# Patient Record
Sex: Female | Born: 1950 | Race: Black or African American | Hispanic: No | Marital: Married | Smoking: Never smoker
Health system: Southern US, Community
[De-identification: ages and names within clinical notes are randomized; demographics above are authoritative.]

## PROBLEM LIST (undated history)

## (undated) DIAGNOSIS — S92902A Unspecified fracture of left foot, initial encounter for closed fracture: Secondary | ICD-10-CM

## (undated) DIAGNOSIS — M170 Bilateral primary osteoarthritis of knee: Secondary | ICD-10-CM

## (undated) DIAGNOSIS — K219 Gastro-esophageal reflux disease without esophagitis: Secondary | ICD-10-CM

## (undated) DIAGNOSIS — K449 Diaphragmatic hernia without obstruction or gangrene: Secondary | ICD-10-CM

## (undated) DIAGNOSIS — M8580 Other specified disorders of bone density and structure, unspecified site: Secondary | ICD-10-CM

## (undated) DIAGNOSIS — M1711 Unilateral primary osteoarthritis, right knee: Secondary | ICD-10-CM

## (undated) DIAGNOSIS — Z98891 History of uterine scar from previous surgery: Secondary | ICD-10-CM

## (undated) DIAGNOSIS — M1990 Unspecified osteoarthritis, unspecified site: Secondary | ICD-10-CM

## (undated) DIAGNOSIS — I10 Essential (primary) hypertension: Secondary | ICD-10-CM

## (undated) DIAGNOSIS — D649 Anemia, unspecified: Secondary | ICD-10-CM

## (undated) DIAGNOSIS — R7303 Prediabetes: Secondary | ICD-10-CM

## (undated) DIAGNOSIS — K7689 Other specified diseases of liver: Secondary | ICD-10-CM

## (undated) DIAGNOSIS — M47816 Spondylosis without myelopathy or radiculopathy, lumbar region: Secondary | ICD-10-CM

## (undated) DIAGNOSIS — N938 Other specified abnormal uterine and vaginal bleeding: Secondary | ICD-10-CM

## (undated) DIAGNOSIS — E785 Hyperlipidemia, unspecified: Secondary | ICD-10-CM

## (undated) DIAGNOSIS — N281 Cyst of kidney, acquired: Secondary | ICD-10-CM

## (undated) HISTORY — PX: JOINT REPLACEMENT: SHX530

---

## 2004-03-06 ENCOUNTER — Ambulatory Visit: Payer: Self-pay | Admitting: Unknown Physician Specialty

## 2005-12-11 ENCOUNTER — Ambulatory Visit: Payer: Self-pay | Admitting: Unknown Physician Specialty

## 2007-02-02 ENCOUNTER — Ambulatory Visit: Payer: Self-pay | Admitting: Unknown Physician Specialty

## 2007-09-23 ENCOUNTER — Ambulatory Visit: Payer: Self-pay | Admitting: Unknown Physician Specialty

## 2008-07-06 ENCOUNTER — Ambulatory Visit: Payer: Self-pay | Admitting: Unknown Physician Specialty

## 2009-10-26 ENCOUNTER — Ambulatory Visit: Payer: Self-pay | Admitting: Unknown Physician Specialty

## 2010-03-24 HISTORY — PX: STOMACH SURGERY: SHX791

## 2010-08-21 ENCOUNTER — Ambulatory Visit: Payer: Self-pay | Admitting: Unknown Physician Specialty

## 2010-08-27 ENCOUNTER — Ambulatory Visit: Payer: Self-pay | Admitting: Unknown Physician Specialty

## 2010-09-10 ENCOUNTER — Ambulatory Visit: Payer: Self-pay | Admitting: Gastroenterology

## 2010-09-11 ENCOUNTER — Ambulatory Visit: Payer: Self-pay | Admitting: Gastroenterology

## 2010-09-11 LAB — PATHOLOGY REPORT

## 2010-09-14 ENCOUNTER — Inpatient Hospital Stay: Payer: Self-pay | Admitting: Surgery

## 2011-02-27 ENCOUNTER — Ambulatory Visit: Payer: Self-pay | Admitting: Unknown Physician Specialty

## 2012-03-01 ENCOUNTER — Ambulatory Visit: Payer: Self-pay | Admitting: Unknown Physician Specialty

## 2013-03-02 ENCOUNTER — Ambulatory Visit: Payer: Self-pay | Admitting: Internal Medicine

## 2014-05-03 ENCOUNTER — Ambulatory Visit: Payer: Self-pay | Admitting: Internal Medicine

## 2015-05-01 ENCOUNTER — Other Ambulatory Visit: Payer: Self-pay | Admitting: Internal Medicine

## 2015-05-01 DIAGNOSIS — Z1231 Encounter for screening mammogram for malignant neoplasm of breast: Secondary | ICD-10-CM

## 2015-05-01 DIAGNOSIS — Z1239 Encounter for other screening for malignant neoplasm of breast: Secondary | ICD-10-CM

## 2015-05-11 ENCOUNTER — Other Ambulatory Visit: Payer: Self-pay | Admitting: Internal Medicine

## 2015-05-11 ENCOUNTER — Ambulatory Visit
Admission: RE | Admit: 2015-05-11 | Discharge: 2015-05-11 | Disposition: A | Discharge status: Home / Self Care | Payer: Medicare Other | Source: Ambulatory Visit | Attending: Internal Medicine | Admitting: Internal Medicine

## 2015-05-11 DIAGNOSIS — Z1231 Encounter for screening mammogram for malignant neoplasm of breast: Secondary | ICD-10-CM | POA: Diagnosis present

## 2015-05-11 DIAGNOSIS — Z1239 Encounter for other screening for malignant neoplasm of breast: Secondary | ICD-10-CM

## 2016-04-03 ENCOUNTER — Other Ambulatory Visit: Payer: Self-pay | Admitting: Internal Medicine

## 2016-04-03 DIAGNOSIS — Z1231 Encounter for screening mammogram for malignant neoplasm of breast: Secondary | ICD-10-CM

## 2016-05-12 ENCOUNTER — Ambulatory Visit
Admission: RE | Admit: 2016-05-12 | Discharge: 2016-05-12 | Disposition: A | Discharge status: Home / Self Care | Payer: Medicare Other | Source: Ambulatory Visit | Attending: Internal Medicine | Admitting: Internal Medicine

## 2016-05-12 DIAGNOSIS — Z1231 Encounter for screening mammogram for malignant neoplasm of breast: Secondary | ICD-10-CM

## 2017-05-19 ENCOUNTER — Other Ambulatory Visit: Payer: Self-pay | Admitting: Internal Medicine

## 2017-05-19 DIAGNOSIS — Z1231 Encounter for screening mammogram for malignant neoplasm of breast: Secondary | ICD-10-CM

## 2017-05-21 ENCOUNTER — Ambulatory Visit
Admission: RE | Admit: 2017-05-21 | Discharge: 2017-05-21 | Disposition: A | Discharge status: Home / Self Care | Payer: Medicare Other | Source: Ambulatory Visit | Attending: Internal Medicine | Admitting: Internal Medicine

## 2017-05-21 DIAGNOSIS — Z1231 Encounter for screening mammogram for malignant neoplasm of breast: Secondary | ICD-10-CM

## 2017-08-04 ENCOUNTER — Encounter: Payer: Self-pay | Admitting: *Deleted

## 2017-08-04 ENCOUNTER — Emergency Department
Admission: EM | Admit: 2017-08-04 | Discharge: 2017-08-05 | Disposition: A | Discharge status: Home / Self Care | Payer: Medicare Other | Attending: Emergency Medicine | Admitting: Emergency Medicine

## 2017-08-04 ENCOUNTER — Other Ambulatory Visit: Payer: Self-pay

## 2017-08-04 DIAGNOSIS — R42 Dizziness and giddiness: Secondary | ICD-10-CM | POA: Insufficient documentation

## 2017-08-04 DIAGNOSIS — R61 Generalized hyperhidrosis: Secondary | ICD-10-CM | POA: Diagnosis not present

## 2017-08-04 DIAGNOSIS — I10 Essential (primary) hypertension: Secondary | ICD-10-CM | POA: Diagnosis not present

## 2017-08-04 DIAGNOSIS — R55 Syncope and collapse: Secondary | ICD-10-CM

## 2017-08-04 DIAGNOSIS — E860 Dehydration: Secondary | ICD-10-CM

## 2017-08-04 DIAGNOSIS — H538 Other visual disturbances: Secondary | ICD-10-CM | POA: Insufficient documentation

## 2017-08-04 LAB — BASIC METABOLIC PANEL
ANION GAP: 12 (ref 5–15)
BUN: 29 mg/dL — ABNORMAL HIGH (ref 6–20)
CO2: 26 mmol/L (ref 22–32)
Calcium: 9.8 mg/dL (ref 8.9–10.3)
Chloride: 100 mmol/L — ABNORMAL LOW (ref 101–111)
Creatinine, Ser: 1.41 mg/dL — ABNORMAL HIGH (ref 0.44–1.00)
GFR calc Af Amer: 43 mL/min — ABNORMAL LOW (ref >60)
GFR, EST NON AFRICAN AMERICAN: 37 mL/min — AB (ref >60)
GLUCOSE: 144 mg/dL — AB (ref 65–99)
POTASSIUM: 2.6 mmol/L — AB (ref 3.5–5.1)
Sodium: 138 mmol/L (ref 135–145)

## 2017-08-04 LAB — CBC WITH DIFFERENTIAL/PLATELET
BASOS ABS: 0.1 10*3/uL (ref 0–0.1)
Basophils Relative: 1 %
Eosinophils Absolute: 0.2 10*3/uL (ref 0–0.7)
Eosinophils Relative: 2 %
HEMATOCRIT: 40.8 % (ref 35.0–47.0)
HEMOGLOBIN: 13.6 g/dL (ref 12.0–16.0)
LYMPHS PCT: 40 %
Lymphs Abs: 3.9 10*3/uL — ABNORMAL HIGH (ref 1.0–3.6)
MCH: 30.1 pg (ref 26.0–34.0)
MCHC: 33.4 g/dL (ref 32.0–36.0)
MCV: 90.1 fL (ref 80.0–100.0)
Monocytes Absolute: 0.8 10*3/uL (ref 0.2–0.9)
Monocytes Relative: 9 %
NEUTROS ABS: 4.6 10*3/uL (ref 1.4–6.5)
NEUTROS PCT: 48 %
Platelets: 337 10*3/uL (ref 150–440)
RBC: 4.53 MIL/uL (ref 3.80–5.20)
RDW: 12.9 % (ref 11.5–14.5)
WBC: 9.6 10*3/uL (ref 3.6–11.0)

## 2017-08-04 MED ORDER — POTASSIUM CHLORIDE CRYS ER 20 MEQ PO TBCR
40.0000 meq | EXTENDED_RELEASE_TABLET | Freq: Once | ORAL | Status: AC
  Administered 2017-08-04: 40 meq via ORAL
  Filled 2017-08-04: qty 2

## 2017-08-04 MED ORDER — SODIUM CHLORIDE 0.9 % IV BOLUS
1000.0000 mL | Freq: Once | INTRAVENOUS | Status: AC
  Administered 2017-08-04: 1000 mL via INTRAVENOUS

## 2017-08-04 MED ORDER — ONDANSETRON 4 MG PO TBDP: 4.0000 mg | ORAL_TABLET | Freq: Three times a day (TID) | ORAL | 0 refills | Status: DC | PRN

## 2017-08-04 NOTE — ED Triage Notes (Signed)
Pt brought in via ems from [employer] with a syncopal episode.  No chest pain or sob.  Pt alert on arrival to er.

## 2017-08-04 NOTE — ED Provider Notes (Signed)
[facility] Emergency Department Provider Note  ____________________________________________  Time seen: Approximately 11:31 PM  I have reviewed the triage vital signs and the nursing notes.   HISTORY  Chief Complaint Loss of Consciousness    HPI Kristy Richardson is a 67 y.o. female comes the ED complaining of syncope. No preceding symptoms, she was in a restaurant, he suddenly felt hot flashed sweaty and lightheaded with vision darkening. Then woke up on the floor after what she feels like was probably just a few seconds. No significant symptoms afterward. He feels back to normal. Does report that she had not had much water to drink and feels dehydrated.      past medical history GERD Hypertension Hyperlipidemia   There are no active problems to display for this patient.    past surgical history Cesarean section   Prior to Admission medications   Medication Sig Start Date End Date Taking? Authorizing Provider  ondansetron (ZOFRAN ODT) 4 MG disintegrating tablet Take 1 tablet (4 mg total) by mouth every 8 (eight) hours as needed for nausea or vomiting. 08/04/17   Sharman Cheek, MD     Allergies Patient has no known allergies.   Family History  Problem Relation Age of Onset  . Breast cancer Neg Hx     Social History Social History   Tobacco Use  . Smoking status: Never Smoker  . Smokeless tobacco: Never Used  Substance Use Topics  . Alcohol use: Never    Frequency: Never  . Drug use: Never    Review of Systems  Constitutional:   No fever or chills.  ENT:   No sore throat. No rhinorrhea. Cardiovascular:   No chest pain or syncope. Respiratory:   No dyspnea or cough. Gastrointestinal:   Negative for abdominal pain, vomiting and diarrhea.  Musculoskeletal:   Negative for focal pain or swelling All other systems reviewed and are negative except as documented above in ROS and  HPI.  ____________________________________________   PHYSICAL EXAM:  VITAL SIGNS: ED Triage Vitals  Enc Vitals Group     BP 08/04/17 2104 (!) 100/50     Pulse Rate 08/04/17 2104 67     Resp 08/04/17 2104 20     Temp 08/04/17 2100 98 F (36.7 C)     Temp Source 08/04/17 2100 Oral     SpO2 08/04/17 2104 95 %     Weight 08/04/17 2101 187 lb (84.8 kg)     Height 08/04/17 2101  (1.6 m)     Head Circumference --      Peak Flow --      Pain Score 08/04/17 2200 0     Pain Loc --      Pain Edu? --      Excl. in GC? --     Vital signs reviewed, nursing assessments reviewed.   Constitutional:   Alert and oriented. Well appearing and in no distress. Eyes:   Conjunctivae are normal. EOMI. PERRL. ENT      Head:   Normocephalic and atraumatic.      Nose:   No congestion/rhinnorhea.       Mouth/Throat:   dry mucous membranes, no pharyngeal erythema. No peritonsillar mass.       Neck:   No meningismus. Full ROM. Hematological/Lymphatic/Immunilogical:   No cervical lymphadenopathy. Cardiovascular:   RRR. Symmetric bilateral radial and DP pulses.  No murmurs.  Respiratory:   Normal respiratory effort without tachypnea/retractions. Breath sounds are clear and equal bilaterally. No  wheezes/rales/rhonchi. Gastrointestinal:   Soft and nontender. Non distended. There is no CVA tenderness.  No rebound, rigidity, or guarding.  Musculoskeletal:   Normal range of motion in all extremities. No joint effusions.  No lower extremity tenderness.  No edema. Neurologic:   Normal speech and language.  Motor grossly intact. No acute focal neurologic deficits are appreciated.  Skin:    Skin is warm, dry and intact. No rash noted.  No petechiae, purpura, or bullae.  ____________________________________________    LABS (pertinent positives/negatives) (all labs ordered are listed, but only abnormal results are displayed) Labs Reviewed  BASIC METABOLIC PANEL - Abnormal; Notable for the following  components:      Result Value   Potassium 2.6 (*)    Chloride 100 (*)    Glucose, Bld 144 (*)    BUN 29 (*)    Creatinine, Ser 1.41 (*)    GFR calc non Af Amer 37 (*)    GFR calc Af Amer 43 (*)    All other components within normal limits  CBC WITH DIFFERENTIAL/PLATELET - Abnormal; Notable for the following components:   Lymphs Abs 3.9 (*)    All other components within normal limits   ____________________________________________   EKG  interpreted by me  Date: 08/04/2017  Rate: 70  Rhythm: normal sinus rhythm  QRS Axis: normal  Intervals: normal  ST/T Wave abnormalities: normal  Conduction Disutrbances: none  Narrative Interpretation: unremarkable      ____________________________________________    RADIOLOGY  No results found.  ____________________________________________   PROCEDURES Procedures  ____________________________________________    CLINICAL IMPRESSION / ASSESSMENT AND PLAN / ED COURSE  Pertinent labs & imaging results that were available during my care of the patient were reviewed by me and considered in my medical decision making (see chart for details).    patient well appearing no acute distress, presents with syncope. By history highly consistent with vasovagal syncope. No red flag symptoms or exam findings. Vital signs are normal. Does appear to be dehydrated. Labs are unremarkable. Patient given IV fluids, feeling better. We'll discharge home for outpatient follow-up. Low suspicion for ACS PE dissection AAA or mesenteric ischemia biliary disease pancreatitis bowel obstruction or perforation. I doubt stroke intracranial hemorrhage or infectious process.      ____________________________________________   FINAL CLINICAL IMPRESSION(S) / ED DIAGNOSES    Final diagnoses:  Dehydration  Syncope, unspecified syncope type     ED Discharge Orders        Ordered    ondansetron (ZOFRAN ODT) 4 MG disintegrating tablet  Every 8 hours  PRN     08/04/17 2330      Portions of this note were generated with dragon dictation software. Dictation errors may occur despite best attempts at proofreading.    Sharman Cheek, MD 08/04/17 [phone]

## 2017-08-05 NOTE — ED Notes (Signed)
Pt discharged to home.  Family member driving.  Discharge instructions reviewed.  Verbalized understanding.  No questions or concerns at this time.  Teach back verified.  Pt in NAD.  No items left in ED.   

## 2018-01-20 IMAGING — MG MM DIGITAL SCREENING BILAT W/ TOMO W/ CAD
8 of 12 series · 8 of 28 positions shown · non-contrast
Comparison: Previous exam(s).

CLINICAL DATA: Screening.

EXAM:
2D DIGITAL SCREENING BILATERAL MAMMOGRAM WITH CAD AND ADJUNCT TOMO

[R MLO synth-2D]
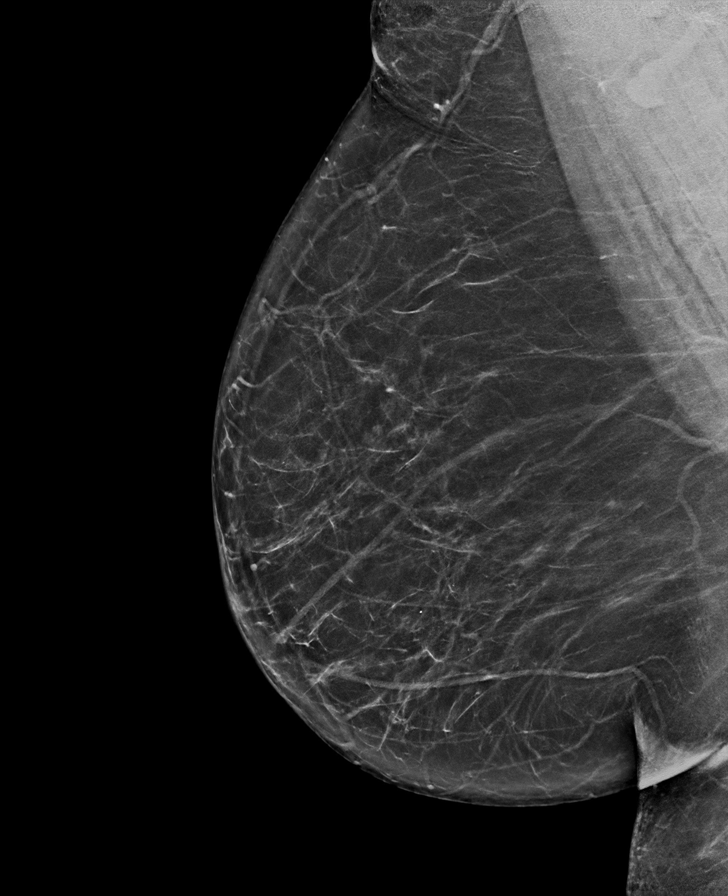

[L MLO synth-2D]
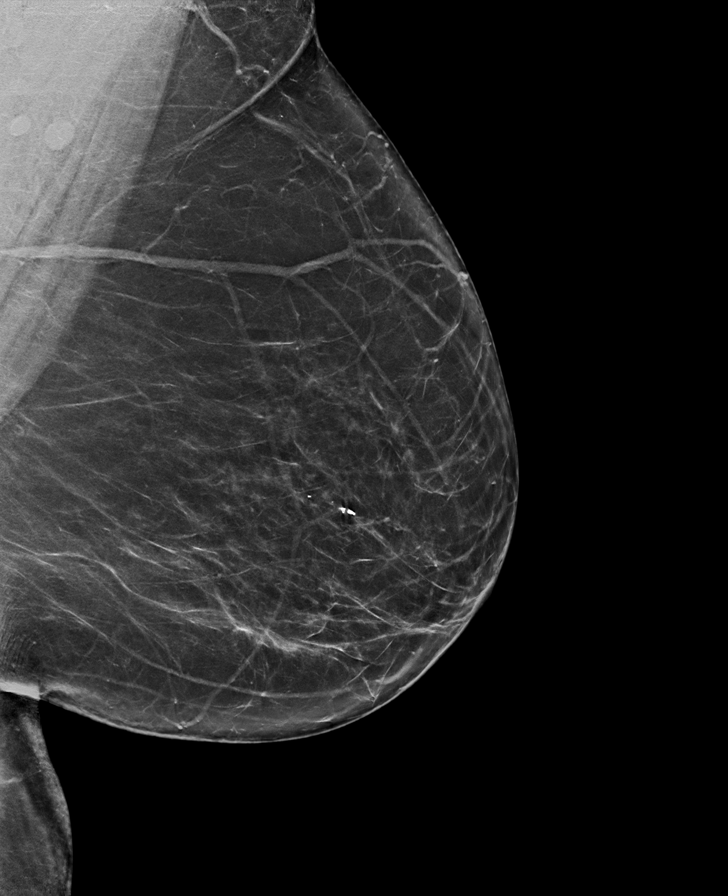

[L MLO]
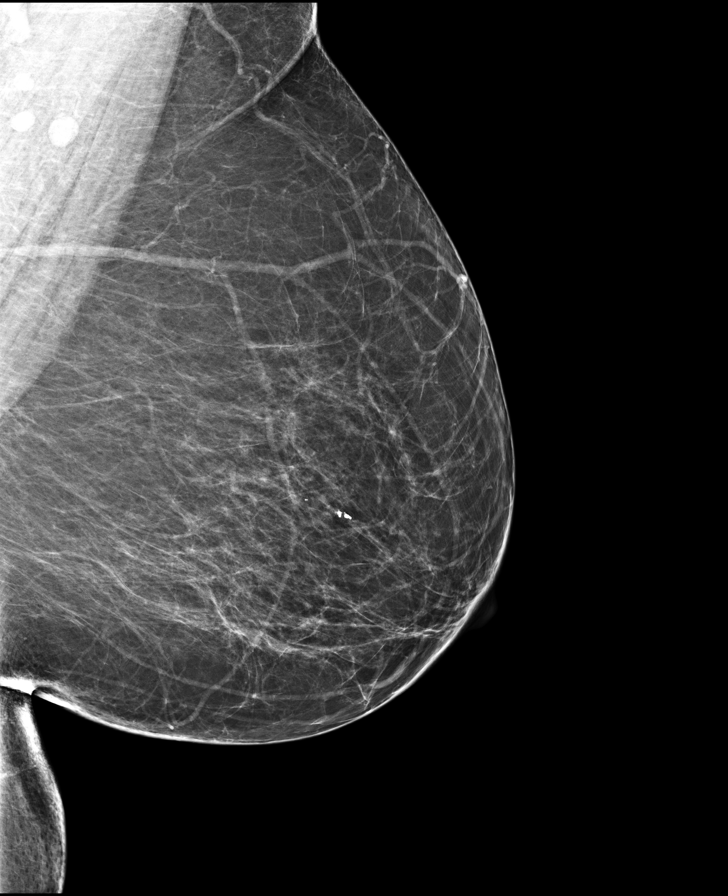

[R CC]
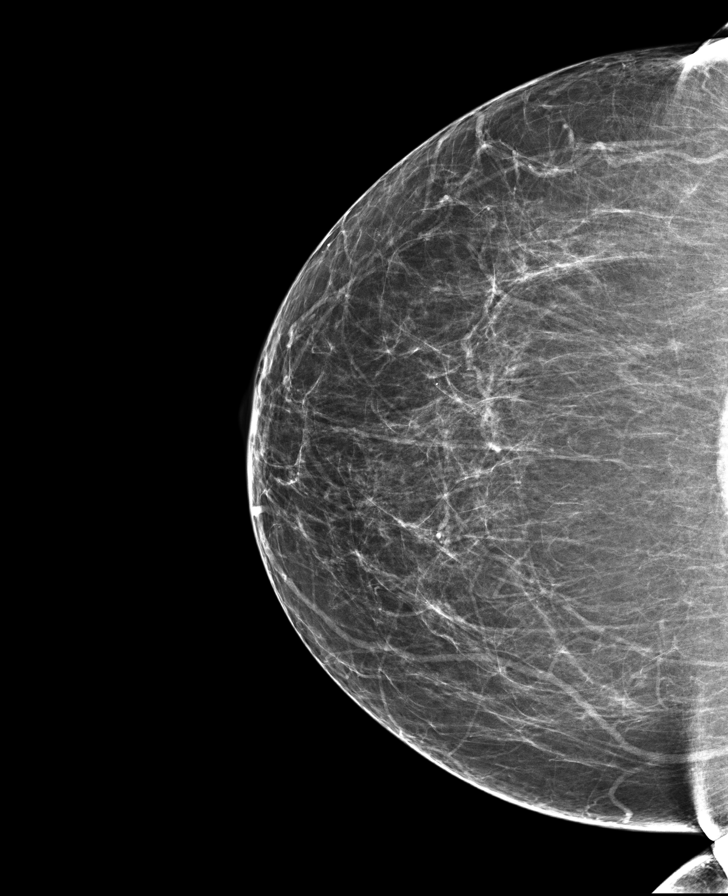

[L CC]
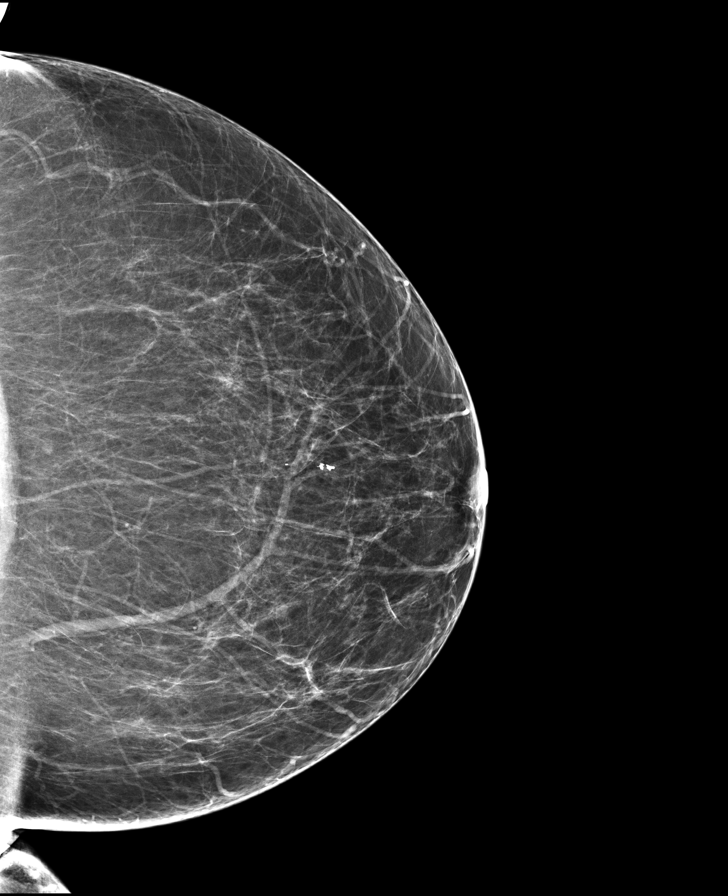

[L CC synth-2D]
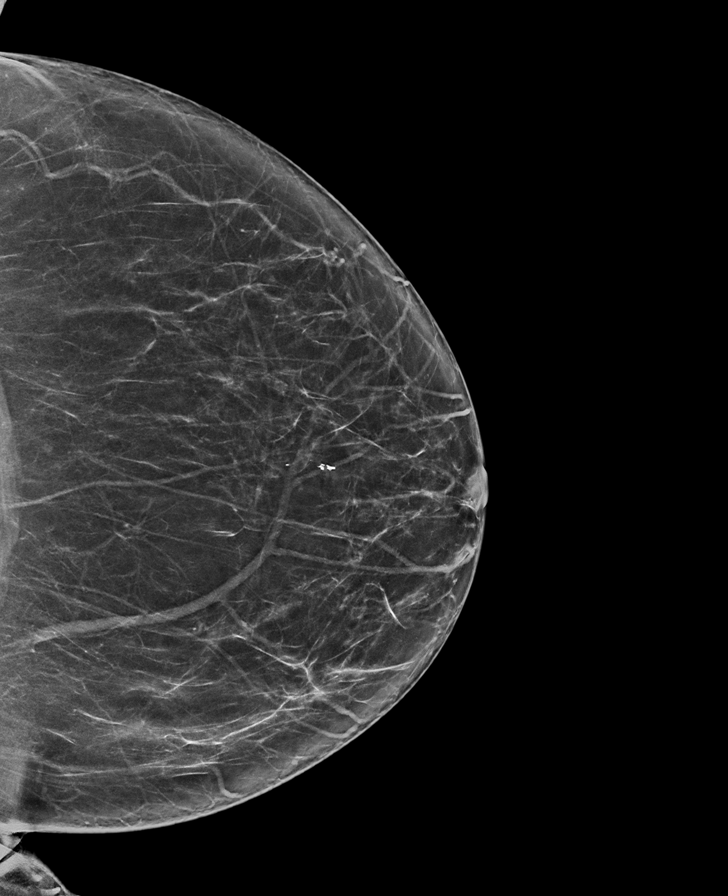

[R MLO]
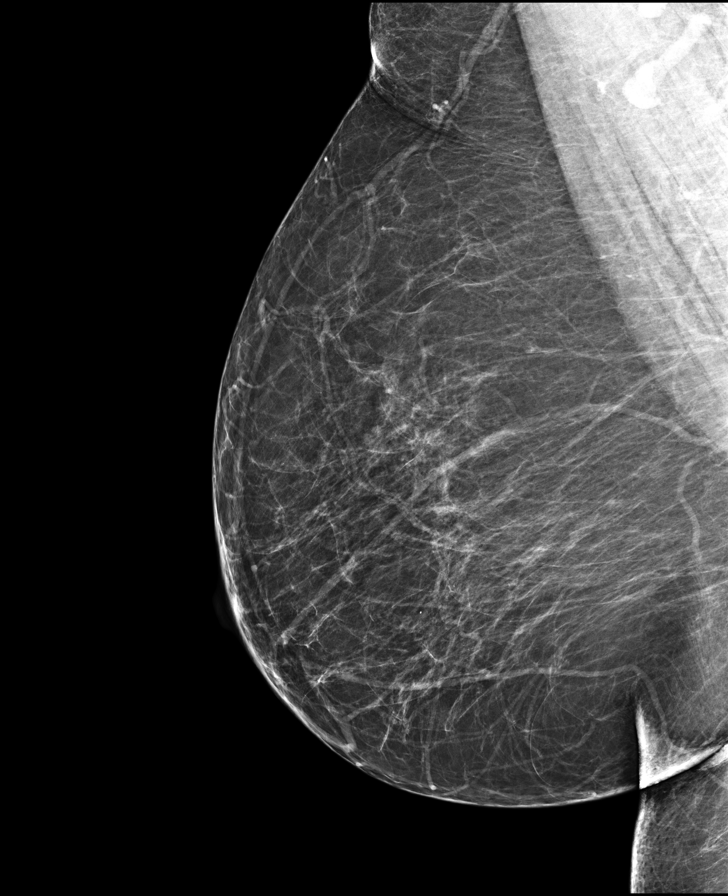

[R CC synth-2D]
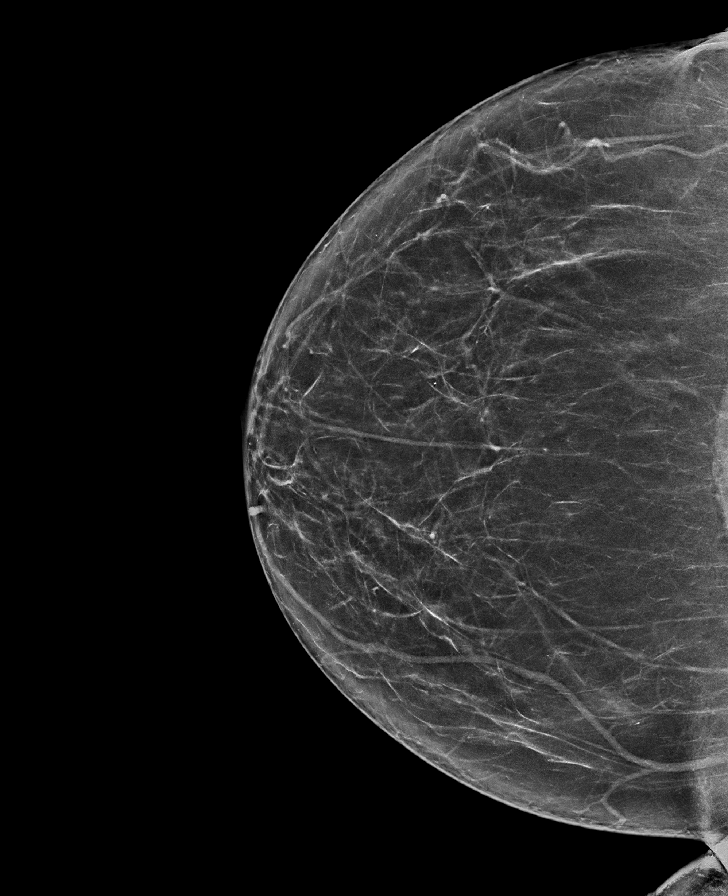

[8 of 28 positions shown; findings below may reference images not displayed]

ACR Breast Density Category b: There are scattered areas of
fibroglandular density.
FINDINGS: There are no findings suspicious for malignancy. Images were
processed with CAD.
IMPRESSION: No mammographic evidence of malignancy. A result letter of this
screening mammogram will be mailed directly to the patient.

RECOMMENDATION:
Screening mammogram in one year. (Code:97-6-RS4)

BI-RADS CATEGORY  1: Negative.

## 2018-03-03 ENCOUNTER — Other Ambulatory Visit: Payer: Self-pay | Admitting: Internal Medicine

## 2018-03-03 DIAGNOSIS — N289 Disorder of kidney and ureter, unspecified: Principal | ICD-10-CM

## 2018-03-03 DIAGNOSIS — N189 Chronic kidney disease, unspecified: Secondary | ICD-10-CM

## 2018-03-08 ENCOUNTER — Ambulatory Visit
Admission: RE | Admit: 2018-03-08 | Discharge: 2018-03-08 | Disposition: A | Discharge status: Home / Self Care | Payer: Medicare Other | Source: Ambulatory Visit | Attending: Internal Medicine | Admitting: Internal Medicine

## 2018-03-08 DIAGNOSIS — N189 Chronic kidney disease, unspecified: Secondary | ICD-10-CM | POA: Diagnosis not present

## 2018-03-08 DIAGNOSIS — N281 Cyst of kidney, acquired: Secondary | ICD-10-CM | POA: Diagnosis not present

## 2018-03-08 DIAGNOSIS — N289 Disorder of kidney and ureter, unspecified: Secondary | ICD-10-CM

## 2018-07-02 ENCOUNTER — Other Ambulatory Visit: Payer: Self-pay | Admitting: Internal Medicine

## 2018-07-02 DIAGNOSIS — Z1231 Encounter for screening mammogram for malignant neoplasm of breast: Secondary | ICD-10-CM

## 2018-08-31 ENCOUNTER — Ambulatory Visit
Admission: RE | Admit: 2018-08-31 | Discharge: 2018-08-31 | Disposition: A | Discharge status: Home / Self Care | Payer: Medicare Other | Source: Ambulatory Visit | Attending: Internal Medicine | Admitting: Internal Medicine

## 2018-08-31 ENCOUNTER — Other Ambulatory Visit: Payer: Self-pay

## 2018-08-31 DIAGNOSIS — Z1231 Encounter for screening mammogram for malignant neoplasm of breast: Secondary | ICD-10-CM | POA: Insufficient documentation

## 2019-09-12 ENCOUNTER — Other Ambulatory Visit: Payer: Self-pay | Admitting: Orthopedic Surgery

## 2019-09-12 DIAGNOSIS — M170 Bilateral primary osteoarthritis of knee: Secondary | ICD-10-CM

## 2019-09-15 ENCOUNTER — Ambulatory Visit
Admission: RE | Admit: 2019-09-15 | Discharge: 2019-09-15 | Disposition: A | Discharge status: Home / Self Care | Payer: Medicare PPO | Source: Ambulatory Visit | Attending: Orthopedic Surgery | Admitting: Orthopedic Surgery

## 2019-09-15 ENCOUNTER — Other Ambulatory Visit: Payer: Self-pay

## 2019-09-15 DIAGNOSIS — M170 Bilateral primary osteoarthritis of knee: Secondary | ICD-10-CM | POA: Diagnosis not present

## 2019-09-23 ENCOUNTER — Ambulatory Visit: Payer: Medicare Other

## 2019-10-21 ENCOUNTER — Other Ambulatory Visit: Payer: Self-pay | Admitting: Orthopedic Surgery

## 2019-11-01 ENCOUNTER — Other Ambulatory Visit
Admission: RE | Admit: 2019-11-01 | Discharge: 2019-11-01 | Disposition: A | Discharge status: Home / Self Care | Payer: Medicare PPO | Source: Ambulatory Visit | Attending: Orthopedic Surgery | Admitting: Orthopedic Surgery

## 2019-11-01 ENCOUNTER — Other Ambulatory Visit: Payer: Self-pay

## 2019-11-01 DIAGNOSIS — Z01810 Encounter for preprocedural cardiovascular examination: Secondary | ICD-10-CM | POA: Diagnosis not present

## 2019-11-01 DIAGNOSIS — Z01818 Encounter for other preprocedural examination: Secondary | ICD-10-CM | POA: Diagnosis present

## 2019-11-01 HISTORY — DX: Essential (primary) hypertension: I10

## 2019-11-01 HISTORY — DX: Anemia, unspecified: D64.9

## 2019-11-01 HISTORY — DX: Unspecified osteoarthritis, unspecified site: M19.90

## 2019-11-01 HISTORY — DX: Unspecified fracture of left foot, initial encounter for closed fracture: S92.902A

## 2019-11-01 HISTORY — DX: History of uterine scar from previous surgery: Z98.891

## 2019-11-01 HISTORY — DX: Other specified abnormal uterine and vaginal bleeding: N93.8

## 2019-11-01 HISTORY — DX: Hyperlipidemia, unspecified: E78.5

## 2019-11-01 LAB — COMPREHENSIVE METABOLIC PANEL
ALT: 13 U/L (ref 0–44)
AST: 14 U/L — ABNORMAL LOW (ref 15–41)
Albumin: 4.1 g/dL (ref 3.5–5.0)
Alkaline Phosphatase: 81 U/L (ref 38–126)
Anion gap: 10 (ref 5–15)
BUN: 17 mg/dL (ref 8–23)
CO2: 24 mmol/L (ref 22–32)
Calcium: 9.2 mg/dL (ref 8.9–10.3)
Chloride: 107 mmol/L (ref 98–111)
Creatinine, Ser: 0.78 mg/dL (ref 0.44–1.00)
GFR calc Af Amer: >60 mL/min (ref >60)
GFR calc non Af Amer: >60 mL/min (ref >60)
Glucose, Bld: 90 mg/dL (ref 70–99)
Potassium: 3.8 mmol/L (ref 3.5–5.1)
Sodium: 141 mmol/L (ref 135–145)
Total Bilirubin: 0.6 mg/dL (ref 0.3–1.2)
Total Protein: 7.6 g/dL (ref 6.5–8.1)

## 2019-11-01 LAB — CBC WITH DIFFERENTIAL/PLATELET
Abs Immature Granulocytes: 0.01 10*3/uL (ref 0.00–0.07)
Basophils Absolute: 0 10*3/uL (ref 0.0–0.1)
Basophils Relative: 1 %
Eosinophils Absolute: 0.1 10*3/uL (ref 0.0–0.5)
Eosinophils Relative: 2 %
HCT: 39.9 % (ref 36.0–46.0)
Hemoglobin: 12.9 g/dL (ref 12.0–15.0)
Immature Granulocytes: 0 %
Lymphocytes Relative: 37 %
Lymphs Abs: 2.2 10*3/uL (ref 0.7–4.0)
MCH: 29.9 pg (ref 26.0–34.0)
MCHC: 32.3 g/dL (ref 30.0–36.0)
MCV: 92.4 fL (ref 80.0–100.0)
Monocytes Absolute: 0.4 10*3/uL (ref 0.1–1.0)
Monocytes Relative: 7 %
Neutro Abs: 3.2 10*3/uL (ref 1.7–7.7)
Neutrophils Relative %: 53 %
Platelets: 272 10*3/uL (ref 150–400)
RBC: 4.32 MIL/uL (ref 3.87–5.11)
RDW: 12.4 % (ref 11.5–15.5)
WBC: 5.9 10*3/uL (ref 4.0–10.5)
nRBC: 0 % (ref 0.0–0.2)

## 2019-11-01 LAB — URINALYSIS, ROUTINE W REFLEX MICROSCOPIC
Bacteria, UA: NONE SEEN
Bilirubin Urine: NEGATIVE
Glucose, UA: NEGATIVE mg/dL
Ketones, ur: NEGATIVE mg/dL
Nitrite: NEGATIVE
Protein, ur: 30 mg/dL — AB
Specific Gravity, Urine: 1.024 (ref 1.005–1.030)
pH: 5 (ref 5.0–8.0)

## 2019-11-01 LAB — TYPE AND SCREEN
ABO/RH(D): O POS
Antibody Screen: NEGATIVE

## 2019-11-01 LAB — SURGICAL PCR SCREEN
MRSA, PCR: NEGATIVE
Staphylococcus aureus: NEGATIVE

## 2019-11-01 NOTE — Patient Instructions (Signed)
COVID TESTING Date: 11-04-3019 FRIDAY Testing site:  [facility] [address] Thru Hours:  9:89 am - 1:00 pm Once you are tested, you are asked to stay quarantined (avoiding public places) until after your surgery.   Your procedure is scheduled on: Tuesday November 08, 2019 Report to Day Surgery on the 2nd floor of the [facility]. To find out your arrival time, please call [phone] between 1PM - 3PM on: Monday November 07, 2019  REMEMBER: Instructions that are not followed completely may result in serious medical risk, up to and including death; or upon the discretion of your surgeon and anesthesiologist your surgery may need to be rescheduled.  Do not eat food after midnight the night before surgery.  No gum chewing, lozengers or hard candies.  You may however, drink CLEAR liquids up to 2 hours before you are scheduled to arrive for your surgery. Do not drink anything within 2 hours of your scheduled arrival time.  Clear liquids include: - water  - apple juice without pulp - gatorade (not RED) - black coffee or tea (Do NOT add milk or creamers to the coffee or tea) Do NOT drink anything that is not on this list.  Type 1 and Type 2 diabetics should only drink water.  ENSURE PRE-SURGERY CARBOHYDRATE DRINK:  Complete drinking 2 hours prior to scheduled arrival time.  TAKE THESE MEDICATIONS THE MORNING OF SURGERY WITH A SIP OF WATER: TRAMADOL CETIRIZINE AMLODIPINE  Stop Anti-inflammatories (NSAIDS) such as Advil, Aleve, Ibuprofen, Motrin, Naproxen, Naprosyn and ASPIRIN OR Aspirin based products such as Excedrin, Goodys Powder, BC Powder. (May take Tylenol or Acetaminophen if needed.)  Stop ANY OVER THE COUNTER supplements until after surgery. (May continue Vitamin D, Vitamin B, and multivitamin.)  No Alcohol for 24 hours before or after surgery.  No Smoking including e-cigarettes for 24 hours prior to surgery.  No chewable tobacco products  for at least 6 hours prior to surgery.  No nicotine patches on the day of surgery.  Do not use any "recreational" drugs for at least a week prior to your surgery.  Please be advised that the combination of cocaine and anesthesia may have negative outcomes, up to and including death. If you test positive for cocaine, your surgery will be cancelled.  On the morning of surgery brush your teeth with toothpaste and water, you may rinse your mouth with mouthwash if you wish. Do not swallow any toothpaste or mouthwash.  Do not wear jewelry, make-up, hairpins, clips or nail polish.  Do not wear lotions, powders, or perfumes.   Do not shave 48 hours prior to surgery.   Contact lenses, hearing aids and dentures may not be worn into surgery.  Do not bring valuables to the hospital. [facility] is not responsible for any missing/lost belongings or valuables.   Use CHG Soap  as directed on instruction sheet.  Notify your doctor if there is any change in your medical condition (cold, fever, infection).  Wear comfortable clothing (specific to your surgery type) to the hospital.  Plan for stool softeners for home use; pain medications have a tendency to cause constipation. You can also help prevent constipation by eating foods high in fiber such as fruits and vegetables and drinking plenty of fluids as your diet allows.  After surgery, you can help prevent lung complications by doing breathing exercises.  Take deep breaths and cough every 1-2 hours. Your doctor may order a device called an Facilities manager to  help you take deep breaths. When coughing or sneezing, hold a pillow firmly against your incision with both hands. This is called splinting. Doing this helps protect your incision. It also decreases belly discomfort.  If you are being admitted to the hospital overnight, YOU MAY BRING A SMALL BAG WITH YOU.  If you are taking public transportation, you will need to have a responsible  adult (18 years or older) with you. Please confirm with your physician that it is acceptable to use public transportation.   Please call the Pre-admissions Testing Dept. at [phone] if you have any questions about these instructions.  Visitation Policy:  Patients undergoing a surgery or procedure may have one family member or support person with them as long as that person is not COVID-19 positive or experiencing its symptoms.  That person may remain in the waiting area during the procedure.  Children under 57 years of age may have both parents or legal guardians with them during their procedure.  Inpatient Visitation Update:   In an effort to ensure the safety of our team members and our patients, we are implementing a change to our visitation policy:  Effective Monday, Aug. 9, at 7 a.m., inpatients will be allowed one support person.  o The support person may change daily.  o The support person must pass our screening, gel in and out, and wear a mask at all times, including in the patients room.  o Patients must also wear a mask when staff or their support person are in the room.  o The list of exceptions to the visitation policy remains unchanged.  o Masking is required regardless of vaccination status.  Systemwide, no visitors 17 or younger.

## 2019-11-01 NOTE — Progress Notes (Signed)
  [facility] Perioperative Services: Pre-Admission/Anesthesia Testing  Abnormal Lab Notification    Date: 11/01/19  Name: Kristy Richardson MRN:   [id number]  Re: Abnormal labs noted during PAT appointment   Provider(s) Notified: Kennedy Bucker, MD Notification mode: Routed and/or faxed via [facility]   ABNORMAL LAB VALUE(S): Lab Results  Component Value Date   COLORURINE YELLOW (A) 11/01/2019   APPEARANCEUR CLEAR (A) 11/01/2019   LABSPEC 1.024 11/01/2019   PHURINE 5.0 11/01/2019   GLUCOSEU NEGATIVE 11/01/2019   HGBUR SMALL (A) 11/01/2019   BILIRUBINUR NEGATIVE 11/01/2019   KETONESUR NEGATIVE 11/01/2019   PROTEINUR 30 (A) 11/01/2019   NITRITE NEGATIVE 11/01/2019   LEUKOCYTESUR MODERATE (A) 11/01/2019   EPIU 0-5 11/01/2019   WBCU 6-10 11/01/2019   RBCU 0-5 11/01/2019   BACTERIA NONE SEEN 11/01/2019    Notes: Patient scheduled for TKA on 11/08/2019. UA as above. Will add urine C&S to determine presence of pathogenically significant infection. This is a Personal assistant; no formal response is required.  Quentin Mulling, MSN, APRN, FNP-C, CEN [facility]  Peri-operative Services Nurse Practitioner Phone: [phone] 11/01/19 1:18 PM

## 2019-11-02 LAB — URINE CULTURE

## 2019-11-04 ENCOUNTER — Other Ambulatory Visit: Payer: Self-pay

## 2019-11-04 ENCOUNTER — Other Ambulatory Visit
Admission: RE | Admit: 2019-11-04 | Discharge: 2019-11-04 | Disposition: A | Discharge status: Home / Self Care | Payer: Medicare PPO | Source: Ambulatory Visit | Attending: Orthopedic Surgery | Admitting: Orthopedic Surgery

## 2019-11-04 DIAGNOSIS — Z01812 Encounter for preprocedural laboratory examination: Secondary | ICD-10-CM | POA: Insufficient documentation

## 2019-11-04 DIAGNOSIS — Z20822 Contact with and (suspected) exposure to covid-19: Secondary | ICD-10-CM | POA: Diagnosis not present

## 2019-11-05 LAB — SARS CORONAVIRUS 2 (TAT 6-24 HRS): SARS Coronavirus 2: NEGATIVE

## 2019-11-08 ENCOUNTER — Other Ambulatory Visit: Payer: Self-pay

## 2019-11-08 ENCOUNTER — Encounter: Payer: Self-pay | Admitting: Orthopedic Surgery

## 2019-11-08 ENCOUNTER — Inpatient Hospital Stay: Payer: Medicare PPO | Admitting: Anesthesiology

## 2019-11-08 ENCOUNTER — Inpatient Hospital Stay: Payer: Medicare PPO | Admitting: Urgent Care

## 2019-11-08 ENCOUNTER — Inpatient Hospital Stay
Admission: RE | Admit: 2019-11-08 | Discharge: 2019-11-10 | DRG: 470 | Disposition: A | Discharge status: Home Health | Payer: Medicare PPO | Attending: Orthopedic Surgery | Admitting: Orthopedic Surgery

## 2019-11-08 ENCOUNTER — Observation Stay: Payer: Medicare PPO

## 2019-11-08 ENCOUNTER — Encounter
Admission: RE | Disposition: A | Discharge status: Home Health | Payer: Self-pay | Source: Home / Self Care | Attending: Orthopedic Surgery

## 2019-11-08 DIAGNOSIS — Z823 Family history of stroke: Secondary | ICD-10-CM | POA: Diagnosis not present

## 2019-11-08 DIAGNOSIS — Z8042 Family history of malignant neoplasm of prostate: Secondary | ICD-10-CM | POA: Diagnosis not present

## 2019-11-08 DIAGNOSIS — Z8249 Family history of ischemic heart disease and other diseases of the circulatory system: Secondary | ICD-10-CM

## 2019-11-08 DIAGNOSIS — Z801 Family history of malignant neoplasm of trachea, bronchus and lung: Secondary | ICD-10-CM

## 2019-11-08 DIAGNOSIS — E669 Obesity, unspecified: Secondary | ICD-10-CM | POA: Diagnosis present

## 2019-11-08 DIAGNOSIS — G8918 Other acute postprocedural pain: Secondary | ICD-10-CM

## 2019-11-08 DIAGNOSIS — D649 Anemia, unspecified: Secondary | ICD-10-CM | POA: Diagnosis present

## 2019-11-08 DIAGNOSIS — Z96652 Presence of left artificial knee joint: Secondary | ICD-10-CM

## 2019-11-08 DIAGNOSIS — I10 Essential (primary) hypertension: Secondary | ICD-10-CM | POA: Diagnosis present

## 2019-11-08 DIAGNOSIS — E785 Hyperlipidemia, unspecified: Secondary | ICD-10-CM | POA: Diagnosis present

## 2019-11-08 DIAGNOSIS — M47816 Spondylosis without myelopathy or radiculopathy, lumbar region: Secondary | ICD-10-CM | POA: Diagnosis present

## 2019-11-08 DIAGNOSIS — Z20822 Contact with and (suspected) exposure to covid-19: Secondary | ICD-10-CM | POA: Diagnosis present

## 2019-11-08 DIAGNOSIS — K219 Gastro-esophageal reflux disease without esophagitis: Secondary | ICD-10-CM | POA: Diagnosis present

## 2019-11-08 DIAGNOSIS — Z8261 Family history of arthritis: Secondary | ICD-10-CM

## 2019-11-08 DIAGNOSIS — Z6834 Body mass index (BMI) 34.0-34.9, adult: Secondary | ICD-10-CM

## 2019-11-08 DIAGNOSIS — M1712 Unilateral primary osteoarthritis, left knee: Principal | ICD-10-CM | POA: Diagnosis present

## 2019-11-08 HISTORY — PX: TOTAL KNEE ARTHROPLASTY: SHX125

## 2019-11-08 HISTORY — PX: APPLICATION OF WOUND VAC: SHX5189

## 2019-11-08 LAB — CBC
HCT: 35.7 % — ABNORMAL LOW (ref 36.0–46.0)
Hemoglobin: 11.6 g/dL — ABNORMAL LOW (ref 12.0–15.0)
MCH: 30 pg (ref 26.0–34.0)
MCHC: 32.5 g/dL (ref 30.0–36.0)
MCV: 92.2 fL (ref 80.0–100.0)
Platelets: 205 10*3/uL (ref 150–400)
RBC: 3.87 MIL/uL (ref 3.87–5.11)
RDW: 12.6 % (ref 11.5–15.5)
WBC: 10.6 10*3/uL — ABNORMAL HIGH (ref 4.0–10.5)
nRBC: 0 % (ref 0.0–0.2)

## 2019-11-08 LAB — CREATININE, SERUM
Creatinine, Ser: 0.88 mg/dL (ref 0.44–1.00)
GFR calc Af Amer: >60 mL/min (ref >60)
GFR calc non Af Amer: >60 mL/min (ref >60)

## 2019-11-08 LAB — ABO/RH: ABO/RH(D): O POS

## 2019-11-08 SURGERY — ARTHROPLASTY, KNEE, TOTAL
Anesthesia: Spinal | Site: Knee | Laterality: Left

## 2019-11-08 MED ORDER — MENTHOL 3 MG MT LOZG
1.0000 | LOZENGE | OROMUCOSAL | Status: DC | PRN
  Filled 2019-11-08: qty 9

## 2019-11-08 MED ORDER — CHLORHEXIDINE GLUCONATE 0.12 % MT SOLN: 15.0000 mL | Freq: Once | OROMUCOSAL | Status: AC

## 2019-11-08 MED ORDER — FAMOTIDINE 20 MG PO TABS
ORAL_TABLET | ORAL | Status: AC
  Administered 2019-11-08: 20 mg via ORAL
  Filled 2019-11-08: qty 1

## 2019-11-08 MED ORDER — POTASSIUM CHLORIDE CRYS ER 10 MEQ PO TBCR
10.0000 meq | EXTENDED_RELEASE_TABLET | Freq: Every day | ORAL | Status: DC
  Administered 2019-11-08 – 2019-11-10 (×3): 10 meq via ORAL
  Filled 2019-11-08 (×3): qty 1

## 2019-11-08 MED ORDER — FENTANYL CITRATE (PF) 100 MCG/2ML IJ SOLN
INTRAMUSCULAR | Status: AC
  Filled 2019-11-08: qty 2

## 2019-11-08 MED ORDER — DIPHENHYDRAMINE HCL 12.5 MG/5ML PO ELIX: 12.5000 mg | ORAL_SOLUTION | ORAL | Status: DC | PRN

## 2019-11-08 MED ORDER — DOCUSATE SODIUM 100 MG PO CAPS
100.0000 mg | ORAL_CAPSULE | Freq: Two times a day (BID) | ORAL | Status: DC
  Administered 2019-11-08 – 2019-11-10 (×5): 100 mg via ORAL
  Filled 2019-11-08 (×4): qty 1

## 2019-11-08 MED ORDER — PHENOL 1.4 % MT LIQD
1.0000 | OROMUCOSAL | Status: DC | PRN
  Filled 2019-11-08: qty 177

## 2019-11-08 MED ORDER — ORAL CARE MOUTH RINSE: 15.0000 mL | Freq: Once | OROMUCOSAL | Status: AC

## 2019-11-08 MED ORDER — HYDROCODONE-ACETAMINOPHEN 5-325 MG PO TABS
1.0000 | ORAL_TABLET | ORAL | Status: DC | PRN
  Administered 2019-11-09: 2 via ORAL
  Filled 2019-11-08: qty 2

## 2019-11-08 MED ORDER — MORPHINE SULFATE (PF) 2 MG/ML IV SOLN: 0.5000 mg | INTRAVENOUS | Status: DC | PRN

## 2019-11-08 MED ORDER — TRANEXAMIC ACID-NACL 1000-0.7 MG/100ML-% IV SOLN
1000.0000 mg | Freq: Once | INTRAVENOUS | Status: AC
  Administered 2019-11-08: 1000 mg via INTRAVENOUS

## 2019-11-08 MED ORDER — BUPIVACAINE-EPINEPHRINE (PF) 0.25% -1:200000 IJ SOLN
INTRAMUSCULAR | Status: DC | PRN
  Administered 2019-11-08: 30 mL via PERINEURAL

## 2019-11-08 MED ORDER — CHLORHEXIDINE GLUCONATE 0.12 % MT SOLN
OROMUCOSAL | Status: AC
  Administered 2019-11-08: 15 mL via OROMUCOSAL
  Filled 2019-11-08: qty 15

## 2019-11-08 MED ORDER — ONDANSETRON HCL 4 MG/2ML IJ SOLN: 4.0000 mg | Freq: Once | INTRAMUSCULAR | Status: DC | PRN

## 2019-11-08 MED ORDER — PROPOFOL 500 MG/50ML IV EMUL
INTRAVENOUS | Status: AC
  Filled 2019-11-08: qty 50

## 2019-11-08 MED ORDER — PANTOPRAZOLE SODIUM 40 MG PO TBEC
40.0000 mg | DELAYED_RELEASE_TABLET | Freq: Every day | ORAL | Status: DC
  Administered 2019-11-08 – 2019-11-10 (×3): 40 mg via ORAL
  Filled 2019-11-08 (×3): qty 1

## 2019-11-08 MED ORDER — TRAMADOL HCL 50 MG PO TABS
50.0000 mg | ORAL_TABLET | Freq: Four times a day (QID) | ORAL | Status: DC
  Administered 2019-11-08 – 2019-11-10 (×9): 50 mg via ORAL
  Filled 2019-11-08 (×9): qty 1

## 2019-11-08 MED ORDER — METOCLOPRAMIDE HCL 10 MG PO TABS: 5.0000 mg | ORAL_TABLET | Freq: Three times a day (TID) | ORAL | Status: DC | PRN

## 2019-11-08 MED ORDER — NEOMYCIN-POLYMYXIN B GU 40-200000 IR SOLN
Status: DC | PRN
  Administered 2019-11-08: 16 mL

## 2019-11-08 MED ORDER — CEFAZOLIN SODIUM-DEXTROSE 2-4 GM/100ML-% IV SOLN
2.0000 g | Freq: Four times a day (QID) | INTRAVENOUS | Status: AC
  Administered 2019-11-08: 2 g via INTRAVENOUS
  Filled 2019-11-08 (×2): qty 100

## 2019-11-08 MED ORDER — KETOROLAC TROMETHAMINE 30 MG/ML IJ SOLN
INTRAMUSCULAR | Status: DC | PRN
  Administered 2019-11-08: 30 mg via INTRAVENOUS

## 2019-11-08 MED ORDER — ALUM & MAG HYDROXIDE-SIMETH 200-200-20 MG/5ML PO SUSP: 30.0000 mL | ORAL | Status: DC | PRN

## 2019-11-08 MED ORDER — TRANEXAMIC ACID-NACL 1000-0.7 MG/100ML-% IV SOLN
INTRAVENOUS | Status: AC
  Filled 2019-11-08: qty 100

## 2019-11-08 MED ORDER — METOCLOPRAMIDE HCL 5 MG/ML IJ SOLN: 5.0000 mg | Freq: Three times a day (TID) | INTRAMUSCULAR | Status: DC | PRN

## 2019-11-08 MED ORDER — MIDAZOLAM HCL 5 MG/5ML IJ SOLN
INTRAMUSCULAR | Status: DC | PRN
  Administered 2019-11-08: 2 mg via INTRAVENOUS

## 2019-11-08 MED ORDER — ACETAMINOPHEN 325 MG PO TABS: 325.0000 mg | ORAL_TABLET | Freq: Four times a day (QID) | ORAL | Status: DC | PRN

## 2019-11-08 MED ORDER — ACETAMINOPHEN 10 MG/ML IV SOLN
INTRAVENOUS | Status: AC
  Filled 2019-11-08: qty 100

## 2019-11-08 MED ORDER — LACTATED RINGERS IV SOLN
INTRAVENOUS | Status: DC
  Administered 2019-11-08: 10 mL/h via INTRAVENOUS

## 2019-11-08 MED ORDER — MIDAZOLAM HCL 2 MG/2ML IJ SOLN
INTRAMUSCULAR | Status: AC
  Filled 2019-11-08: qty 2

## 2019-11-08 MED ORDER — FENTANYL CITRATE (PF) 100 MCG/2ML IJ SOLN: 25.0000 ug | INTRAMUSCULAR | Status: DC | PRN

## 2019-11-08 MED ORDER — ENOXAPARIN SODIUM 30 MG/0.3ML ~~LOC~~ SOLN
30.0000 mg | Freq: Two times a day (BID) | SUBCUTANEOUS | Status: DC
  Administered 2019-11-09 – 2019-11-10 (×3): 30 mg via SUBCUTANEOUS
  Filled 2019-11-08 (×3): qty 0.3

## 2019-11-08 MED ORDER — CEFAZOLIN SODIUM-DEXTROSE 2-4 GM/100ML-% IV SOLN
2.0000 g | INTRAVENOUS | Status: AC
  Administered 2019-11-08: 2 g via INTRAVENOUS

## 2019-11-08 MED ORDER — SODIUM CHLORIDE 0.9 % IV SOLN
INTRAVENOUS | Status: DC | PRN
  Administered 2019-11-08: 50 ug/min via INTRAVENOUS

## 2019-11-08 MED ORDER — LISINOPRIL 20 MG PO TABS
40.0000 mg | ORAL_TABLET | Freq: Every day | ORAL | Status: DC
  Administered 2019-11-08 – 2019-11-10 (×3): 40 mg via ORAL
  Filled 2019-11-08 (×3): qty 2

## 2019-11-08 MED ORDER — MAGNESIUM HYDROXIDE 400 MG/5ML PO SUSP: 30.0000 mL | Freq: Every day | ORAL | Status: DC | PRN

## 2019-11-08 MED ORDER — METHYLPREDNISOLONE SODIUM SUCC 125 MG IJ SOLR
INTRAMUSCULAR | Status: AC
  Filled 2019-11-08: qty 2

## 2019-11-08 MED ORDER — METHOCARBAMOL 1000 MG/10ML IJ SOLN
500.0000 mg | Freq: Four times a day (QID) | INTRAVENOUS | Status: DC | PRN
  Filled 2019-11-08: qty 5

## 2019-11-08 MED ORDER — METHOCARBAMOL 500 MG PO TABS
500.0000 mg | ORAL_TABLET | Freq: Four times a day (QID) | ORAL | Status: DC | PRN
  Administered 2019-11-08: 500 mg via ORAL
  Filled 2019-11-08: qty 1

## 2019-11-08 MED ORDER — ONDANSETRON HCL 4 MG/2ML IJ SOLN: 4.0000 mg | Freq: Four times a day (QID) | INTRAMUSCULAR | Status: DC | PRN

## 2019-11-08 MED ORDER — SODIUM CHLORIDE FLUSH 0.9 % IV SOLN
INTRAVENOUS | Status: AC
  Filled 2019-11-08: qty 40

## 2019-11-08 MED ORDER — DEXAMETHASONE SODIUM PHOSPHATE 10 MG/ML IJ SOLN
INTRAMUSCULAR | Status: AC
  Filled 2019-11-08: qty 1

## 2019-11-08 MED ORDER — METHYLPREDNISOLONE SODIUM SUCC 125 MG IJ SOLR
40.0000 mg | Freq: Once | INTRAMUSCULAR | Status: AC
  Administered 2019-11-08: 40 mg via INTRAVENOUS

## 2019-11-08 MED ORDER — LORATADINE 10 MG PO TABS
10.0000 mg | ORAL_TABLET | Freq: Every day | ORAL | Status: DC
  Administered 2019-11-09 – 2019-11-10 (×2): 10 mg via ORAL
  Filled 2019-11-08 (×2): qty 1

## 2019-11-08 MED ORDER — PROPOFOL 500 MG/50ML IV EMUL
INTRAVENOUS | Status: DC | PRN
  Administered 2019-11-08: 100 ug/kg/min via INTRAVENOUS

## 2019-11-08 MED ORDER — ONDANSETRON HCL 4 MG PO TABS: 4.0000 mg | ORAL_TABLET | Freq: Four times a day (QID) | ORAL | Status: DC | PRN

## 2019-11-08 MED ORDER — ZOLPIDEM TARTRATE 5 MG PO TABS
5.0000 mg | ORAL_TABLET | Freq: Every evening | ORAL | Status: DC | PRN
  Administered 2019-11-08 – 2019-11-09 (×2): 5 mg via ORAL
  Filled 2019-11-08 (×2): qty 1

## 2019-11-08 MED ORDER — VITAMIN D3 25 MCG (1000 UNIT) PO TABS
2000.0000 [IU] | ORAL_TABLET | Freq: Every day | ORAL | Status: DC
  Administered 2019-11-08 – 2019-11-10 (×3): 2000 [IU] via ORAL
  Filled 2019-11-08 (×5): qty 2

## 2019-11-08 MED ORDER — DEXAMETHASONE SODIUM PHOSPHATE 10 MG/ML IJ SOLN
10.0000 mg | Freq: Once | INTRAMUSCULAR | Status: AC
  Administered 2019-11-09: 10 mg via INTRAVENOUS
  Filled 2019-11-08: qty 1

## 2019-11-08 MED ORDER — BISACODYL 10 MG RE SUPP
10.0000 mg | Freq: Every day | RECTAL | Status: DC | PRN
  Administered 2019-11-10: 10 mg via RECTAL
  Filled 2019-11-08: qty 1

## 2019-11-08 MED ORDER — SODIUM CHLORIDE 0.9 % IV SOLN: INTRAVENOUS | Status: DC

## 2019-11-08 MED ORDER — CHLORHEXIDINE GLUCONATE CLOTH 2 % EX PADS
6.0000 | MEDICATED_PAD | Freq: Every day | CUTANEOUS | Status: DC
  Administered 2019-11-09 (×2): 6 via TOPICAL

## 2019-11-08 MED ORDER — ACETAMINOPHEN 10 MG/ML IV SOLN
INTRAVENOUS | Status: DC | PRN
  Administered 2019-11-08: 1000 mg via INTRAVENOUS

## 2019-11-08 MED ORDER — FAMOTIDINE 20 MG PO TABS: 20.0000 mg | ORAL_TABLET | Freq: Once | ORAL | Status: AC

## 2019-11-08 MED ORDER — MORPHINE SULFATE (PF) 10 MG/ML IV SOLN
INTRAVENOUS | Status: AC
  Filled 2019-11-08: qty 2

## 2019-11-08 MED ORDER — CEFAZOLIN SODIUM-DEXTROSE 2-4 GM/100ML-% IV SOLN
INTRAVENOUS | Status: AC
  Administered 2019-11-08: 2 g via INTRAVENOUS
  Filled 2019-11-08: qty 100

## 2019-11-08 MED ORDER — MAGNESIUM CITRATE PO SOLN
1.0000 | Freq: Once | ORAL | Status: DC | PRN
  Filled 2019-11-08: qty 296

## 2019-11-08 MED ORDER — BUPIVACAINE HCL (PF) 0.5 % IJ SOLN
INTRAMUSCULAR | Status: DC | PRN
  Administered 2019-11-08: 3 mL

## 2019-11-08 MED ORDER — AMLODIPINE BESYLATE 10 MG PO TABS
10.0000 mg | ORAL_TABLET | Freq: Every day | ORAL | Status: DC
  Administered 2019-11-09 – 2019-11-10 (×2): 10 mg via ORAL
  Filled 2019-11-08 (×2): qty 1

## 2019-11-08 MED ORDER — MORPHINE SULFATE (PF) 10 MG/ML IV SOLN
INTRAVENOUS | Status: DC | PRN
  Administered 2019-11-08: 10 mg

## 2019-11-08 MED ORDER — SODIUM CHLORIDE 0.9 % IV SOLN
INTRAVENOUS | Status: DC | PRN
  Administered 2019-11-08: 60 mL

## 2019-11-08 MED ORDER — HYDROCODONE-ACETAMINOPHEN 7.5-325 MG PO TABS
1.0000 | ORAL_TABLET | ORAL | Status: DC | PRN
  Administered 2019-11-08 – 2019-11-09 (×4): 2 via ORAL
  Administered 2019-11-09 – 2019-11-10 (×2): 1 via ORAL
  Filled 2019-11-08 (×2): qty 1
  Filled 2019-11-08 (×4): qty 2

## 2019-11-08 SURGICAL SUPPLY — 71 items
BLADE SAGITTAL 25.0X1.19X90 (BLADE) ×2 IMPLANT
BLADE SAGITTAL AGGR TOOTH XLG (BLADE) ×2 IMPLANT
BLOCK CUTTING FEMUR 4 LT MED (MISCELLANEOUS) ×2 IMPLANT
BLOCK CUTTING TIBIAL 3 LT (MISCELLANEOUS) ×2 IMPLANT
BNDG ELASTIC 6X5.8 VLCR STR LF (GAUZE/BANDAGES/DRESSINGS) ×2 IMPLANT
CANISTER SUCT 1200ML W/VALVE (MISCELLANEOUS) ×2 IMPLANT
CANISTER SUCT 3000ML PPV (MISCELLANEOUS) ×4 IMPLANT
CANISTER WOUND CARE 500ML ATS (WOUND CARE) ×2 IMPLANT
CEMENT HV SMART SET (Cement) ×4 IMPLANT
CHLORAPREP W/TINT 26 (MISCELLANEOUS) ×4 IMPLANT
COOLER POLAR GLACIER W/PUMP (MISCELLANEOUS) ×2 IMPLANT
COVER WAND RF STERILE (DRAPES) ×2 IMPLANT
CUFF TOURN SGL QUICK 30 (TOURNIQUET CUFF) ×1
CUFF TRNQT CYL 30X4X21-28X (TOURNIQUET CUFF) ×1 IMPLANT
DRAPE 3/4 80X56 (DRAPES) ×4 IMPLANT
ELECT CAUTERY BLADE 6.4 (BLADE) ×2 IMPLANT
ELECT REM PT RETURN 9FT ADLT (ELECTROSURGICAL) ×2
ELECTRODE REM PT RTRN 9FT ADLT (ELECTROSURGICAL) ×1 IMPLANT
FEMORAL COMP SZ4 LEFT SPHERE (Femur) ×2 IMPLANT
FEMUR BONE MODEL 4.9010 MEDACT (MISCELLANEOUS) ×2 IMPLANT
GAUZE SPONGE 4X4 12PLY STRL (GAUZE/BANDAGES/DRESSINGS) ×2 IMPLANT
GAUZE XEROFORM 1X8 LF (GAUZE/BANDAGES/DRESSINGS) ×2 IMPLANT
GLOVE BIOGEL PI IND STRL 9 (GLOVE) ×1 IMPLANT
GLOVE BIOGEL PI INDICATOR 9 (GLOVE) ×1
GLOVE INDICATOR 8.0 STRL GRN (GLOVE) ×2 IMPLANT
GLOVE SURG ORTHO 8.0 STRL STRW (GLOVE) ×2 IMPLANT
GLOVE SURG SYN 9.0  PF PI (GLOVE) ×1
GLOVE SURG SYN 9.0 PF PI (GLOVE) ×1 IMPLANT
GOWN SRG 2XL LVL 4 RGLN SLV (GOWNS) ×1 IMPLANT
GOWN STRL NON-REIN 2XL LVL4 (GOWNS) ×1
GOWN STRL REUS W/ TWL LRG LVL3 (GOWN DISPOSABLE) ×1 IMPLANT
GOWN STRL REUS W/ TWL XL LVL3 (GOWN DISPOSABLE) ×1 IMPLANT
GOWN STRL REUS W/TWL LRG LVL3 (GOWN DISPOSABLE) ×1
GOWN STRL REUS W/TWL XL LVL3 (GOWN DISPOSABLE) ×1
HOLDER FOLEY CATH W/STRAP (MISCELLANEOUS) ×2 IMPLANT
HOOD PEEL AWAY FLYTE STAYCOOL (MISCELLANEOUS) ×4 IMPLANT
KIT PREVENA INCISION MGT20CM45 (CANNISTER) ×2 IMPLANT
KIT TURNOVER KIT A (KITS) ×2 IMPLANT
NDL SAFETY ECLIPSE 18X1.5 (NEEDLE) ×2 IMPLANT
NEEDLE HYPO 18GX1.5 SHARP (NEEDLE) ×2
NEEDLE SPNL 18GX3.5 QUINCKE PK (NEEDLE) ×2 IMPLANT
NEEDLE SPNL 20GX3.5 QUINCKE YW (NEEDLE) ×2 IMPLANT
NS IRRIG 1000ML POUR BTL (IV SOLUTION) ×2 IMPLANT
PACK TOTAL KNEE (MISCELLANEOUS) ×2 IMPLANT
PAD WRAPON POLAR KNEE (MISCELLANEOUS) ×1 IMPLANT
PATELLA RESURFACING MEDACTA 02 (Bone Implant) ×2 IMPLANT
PENCIL SMOKE EVACUATOR COATED (MISCELLANEOUS) ×2 IMPLANT
PULSAVAC PLUS IRRIG FAN TIP (DISPOSABLE) ×2
SCALPEL PROTECTED #10 DISP (BLADE) ×4 IMPLANT
SOL .9 NS 3000ML IRR  AL (IV SOLUTION) ×1
SOL .9 NS 3000ML IRR UROMATIC (IV SOLUTION) ×1 IMPLANT
STAPLER SKIN PROX 35W (STAPLE) ×2 IMPLANT
STEM EXTENSION 11MMX30MM (Stem) ×2 IMPLANT
SUCTION FRAZIER HANDLE 10FR (MISCELLANEOUS) ×2
SUCTION TUBE FRAZIER 10FR DISP (MISCELLANEOUS) ×2 IMPLANT
SUT DVC 2 QUILL PDO  T11 36X36 (SUTURE) ×1
SUT DVC 2 QUILL PDO T11 36X36 (SUTURE) ×1 IMPLANT
SUT ETHIBOND 2 V 37 (SUTURE) ×2 IMPLANT
SUT V-LOC 90 ABS DVC 3-0 CL (SUTURE) ×2 IMPLANT
SYR 20ML LL LF (SYRINGE) ×2 IMPLANT
SYR 3ML LL SCALE MARK (SYRINGE) ×2 IMPLANT
SYR 50ML LL SCALE MARK (SYRINGE) ×4 IMPLANT
TIBIAL BONE MODEL LEFT (MISCELLANEOUS) ×2 IMPLANT
TIBIAL INSERT SZ4 LT 02120410F (Insert) ×2 IMPLANT
TIP FAN IRRIG PULSAVAC PLUS (DISPOSABLE) ×1 IMPLANT
TOWEL OR 17X26 4PK STRL BLUE (TOWEL DISPOSABLE) ×2 IMPLANT
TOWER CARTRIDGE SMART MIX (DISPOSABLE) ×2 IMPLANT
TRAY FOLEY MTR SLVR 16FR STAT (SET/KITS/TRAYS/PACK) ×2 IMPLANT
TRAY TIBIAL FIXED T3I4 LEFT (Miscellaneous) ×2 IMPLANT
TUBING CONNECTING 10 (TUBING) ×2 IMPLANT
WRAPON POLAR PAD KNEE (MISCELLANEOUS) ×2

## 2019-11-08 NOTE — Transfer of Care (Signed)
Immediate Anesthesia Transfer of Care Note  Patient: Kristy Richardson  Procedure(s) Performed: TOTAL KNEE ARTHROPLASTY (Left Knee) APPLICATION OF WOUND VAC (Left Knee)  Patient Location: PACU  Anesthesia Type:Spinal  Level of Consciousness: sedated  Airway & Oxygen Therapy: Patient Spontanous Breathing and Patient connected to face mask oxygen  Post-op Assessment: Report given to RN and Post -op Vital signs reviewed and stable  Post vital signs: Reviewed and stable  Last Vitals:  Vitals Value Taken Time  BP 119/69 11/08/19 0913  Temp 36.3 C 11/08/19 0913  Pulse 64 11/08/19 0924  Resp 18 11/08/19 0924  SpO2 93 % 11/08/19 0924  Vitals shown include unvalidated device data.  Last Pain:  Vitals:   11/08/19 0913  TempSrc:   PainSc: 0-No pain         Complications: No complications documented.

## 2019-11-08 NOTE — Anesthesia Procedure Notes (Signed)
Spinal  Patient location during procedure: OR Start time: 11/08/2019 7:22 AM End time: 11/08/2019 7:26 AM Staffing Performed: resident/CRNA  Resident/CRNA: Justus Memory, CRNA Preanesthetic Checklist Completed: patient identified, IV checked, site marked, risks and benefits discussed, surgical consent, monitors and equipment checked, pre-op evaluation and timeout performed Spinal Block Patient position: sitting Prep: Betadine Patient monitoring: heart rate, continuous pulse ox, blood pressure and cardiac monitor Approach: midline Location: L4-5 Injection technique: single-shot Needle Needle type: Whitacre and Introducer  Needle gauge: 24 G Needle length: 9 cm Additional Notes Negative paresthesia. Negative blood return. Positive free-flowing CSF. Expiration date of kit checked and confirmed. Patient tolerated procedure well, without complications.

## 2019-11-08 NOTE — H&P (Signed)
Chief Complaint  Patient presents with  . Left Knee - Pre-op Exam   History of the Present Illness: Kristy Richardson is a 69 y.o. female here for history and physical for left total knee arthroplasty with Dr. Kennedy Bucker on 11/08/2019. She had x-rays back in 05/2019 that showed the left knee quite a bit worse than the right, but both having severe tricompartmental osteoarthritis. She received bilateral knee injections at that time and talked to her about exercising, biking, and walking. The patient states the bilateral knee injections did not help her. She has stiffness and soreness when she gets up in the morning. She goes to water aerobics 5 days a week, which helps her. The patient states her left knee bothers her more than the right. She states her quality of walking is terrible. She is unable to perform most activities of daily living due to the knee pain. She has a hard time standing from a seated position. Patient is unable to walk 1 city block.  I have reviewed past medical, surgical, social and family history, and allergies as documented in the EMR.  Past Medical History: Past Medical History:  Diagnosis Date  . Anemia, unspecified  . Degenerative arthritis of left knee  . Degenerative arthritis of lumbar spine  . Dysfunctional uterine bleeding  . Essential hypertension, benign  . Foot fracture, left  . GERD (gastroesophageal reflux disease)  . Left knee pain  . Low back pain  . Obesity  . Osteoarthrosis, unspecified whether generalized or localized, lower leg  . Other and unspecified hyperlipidemia   Past Surgical History: Past Surgical History:  Procedure Laterality Date  . CESAREAN SECTION  times 2  . foot surgery  . Stomach surgery 2012  . wart removal Left  Left foot   Past Family History: Family History  Problem Relation Age of Onset  . Lung cancer Mother  . High blood pressure (Hypertension) Mother  . Sleep apnea Mother  . Parkinsonism Father  . Prostate  cancer Father  . Myocardial Infarction (Heart attack) Other  . Arthritis Other  . Stroke Other   Medications: Current Outpatient Medications Ordered in Epic  Medication Sig Dispense Refill  . acetaminophen (TYLENOL) 500 MG tablet Take 500-1,000 mg by mouth once daily as needed for Pain  . amLODIPine (NORVASC) 10 MG tablet Take 1 tablet (10 mg total) by mouth once daily 90 tablet 1  . calcium carbonate-vitamin D3 (CALTRATE 600+D) 600 mg(1,500mg ) -400 unit tablet Take 1 tablet by mouth once daily.  . cetirizine (ZYRTEC) 10 MG tablet Take 1 tablet by mouth once daily as needed  . diclofenac (VOLTAREN) 1 % topical gel Apply 2 g topically 4 (four) times daily 150 g 11  . lisinopriL (ZESTRIL) 40 MG tablet Take 1 tablet (40 mg total) by mouth once daily 90 tablet 1  . potassium chloride (KLOR-CON M10) 10 mEq ER tablet Take 1 tablet (10 mEq total) by mouth once daily 90 tablet 1  . traMADoL (ULTRAM) 50 mg tablet Take 1 tablet (50 mg total) by mouth every 6 (six) hours as needed for Pain 40 tablet 1   No current Epic-ordered facility-administered medications on file.   Allergies: No Known Allergies   Body mass index is 33.69 kg/m.  Review of Systems: A comprehensive 14 point ROS was performed, reviewed, and the pertinent orthopaedic findings are documented in the HPI.  Vitals:  11/04/19 1013  BP: (!) 128/90  Pulse: 89   General Physical Examination:  General:  Well developed, well nourished, no apparent distress, normal affect, slow antalgic gait no assistive devices.  HEENT: Head normocephalic, atraumatic, PERRL.   Abdomen: Soft, non tender, non distended, Bowel sounds present.  Heart: Examination of the heart reveals regular, rate, and rhythm. There is no murmur noted on ascultation. There is a normal apical pulse.  Lungs: Lungs are clear to auscultation. There is no wheeze, rhonchi, or crackles. There is normal expansion of bilateral chest walls.   Musculoskeletal  Examination: Left knee has a 10 degree flexion contracture. Left knee has 100 degrees of flexion. No pain with bilateral hip range of motion. Antalgic gait. Patella tracks well. She is able to straight leg raise. No swelling or edema throughout the left leg.  Radiographs: X-rays of the left knee reviewed by me today show severe degenerative changes of the left knee medial compartment, complete loss of joint space with varus deformity. Significant spurring along the lateral femoral condyle. Significant patellofemoral osteoarthritis noted.  Assessment: ICD-10-CM  1. Primary osteoarthritis of left knee M17.12   Plan: 1. Risks, benefits, complications of a left total knee arthroplasty have been discussed with the patient. Patient has agreed and consented procedure with Dr. Kennedy Bucker on 11/08/2019.    Electronically signed by [name], PA at 11/04/2019 11:06 AM EDT  Reviewed paper H+P, will be scanned into chart. No changes noted.

## 2019-11-08 NOTE — Anesthesia Preprocedure Evaluation (Signed)
Anesthesia Evaluation  Patient identified by MRN, date of birth, ID band Patient awake    Reviewed: Allergy & Precautions, H&P , NPO status , Patient's Chart, lab work & pertinent test results, reviewed documented beta blocker date and time   Airway Mallampati: II   Neck ROM: full    Dental  (+) Poor Dentition   Pulmonary neg pulmonary ROS,    Pulmonary exam normal        Cardiovascular Exercise Tolerance: Good hypertension, On Medications negative cardio ROS Normal cardiovascular exam Rhythm:regular Rate:Normal     Neuro/Psych negative neurological ROS  negative psych ROS   GI/Hepatic negative GI ROS, Neg liver ROS,   Endo/Other  negative endocrine ROS  Renal/GU negative Renal ROS  negative genitourinary   Musculoskeletal   Abdominal   Peds  Hematology  (+) Blood dyscrasia, anemia ,   Anesthesia Other Findings Past Medical History: No date: Anemia No date: Arthritis     Comment:  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 No date: Dysfunctional uterine bleeding No date: Foot fracture, left No date: H/O cesarean section     Comment:  x 2 No date: Hyperlipidemia No date: Hypertension Past Surgical History: No date: CESAREAN SECTION     Comment:  x2 2012: STOMACH SURGERY BMI    Body Mass Index: 34.41 kg/m     Reproductive/Obstetrics negative OB ROS                             Anesthesia Physical Anesthesia Plan  ASA: II  Anesthesia Plan: Spinal   Post-op Pain Management:    Induction:   PONV Risk Score and Plan: 3  Airway Management Planned:   Additional Equipment:   Intra-op Plan:   Post-operative Plan:   Informed Consent: I have reviewed the patients History and Physical, chart, labs and discussed the procedure including the risks, benefits and alternatives for the proposed anesthesia with the patient or authorized representative who has indicated  his/her understanding and acceptance.     Dental Advisory Given  Plan Discussed with: CRNA  Anesthesia Plan Comments:         Anesthesia Quick Evaluation

## 2019-11-08 NOTE — Op Note (Signed)
Date 11/08/19  Time 9:05 am   PATIENT:   Kristy Richardson   PRE-OPERATIVE DIAGNOSIS:  Primary localized osteoarthritis of left knee knee   POST-OPERATIVE DIAGNOSIS:  Same   PROCEDURE:  Procedure(s): Left TOTAL KNEE ARTHROPLASTY   SURGEON: Leitha Schuller, MD   ASSISTANTS: Cranston Neighbor, PA-C   ANESTHESIA:   spinal   EBL:   100   BLOOD ADMINISTERED:none   DRAINS: Incisional wound VAC    LOCAL MEDICATIONS USED:  MARCAINE    and OTHER Exparel morphine and Toradol   SPECIMEN:  No Specimen   DISPOSITION OF SPECIMEN:  N/A   COUNTS:  YES   TOURNIQUET:   42 minutes at 300 mm Hg   IMPLANTS: Medacta  GMK sphere system with  for left femur, 3 T4 I left tibia with short stem and  10 mm insert.  Size  2 patella, all components cemented.   DICTATION: Reubin Milan Dictation   patient was brought to the operating room and spinal anesthesia was obtained.  After prepping and draping the  left leg in sterile fashion, and after patient identification and timeout procedures were completed, tourniquet was raised  and midline skin incision was made followed by medial parapatellar arthrotomy with  severe medial compartment osteoarthritis, severe patellofemoral arthritis and  advanced lateral compartment arthritis, partial synovectomy was also carried out.   The ACL and PCL and fat pad were excised along with anterior horns of the meniscus. The proximal tibia cutting guide from  the [facility] system was applied and the proximal tibia cut carried out.  The distal femoral cut was carried out in a similar fashion     The  for femoral cutting guide applied with anterior posterior and chamfer cuts made.  The posterior horns of the menisci were removed at this point.   Injection of the above medication was carried out after the femoral and tibial cuts were carried out.  The  3 T4 I baseplate trial was placed pinned into position and proximal tibial preparation carried out with drilling hand reaming and the keel punch  followed by placement of the  for femur and sizing the tibial insert size   10 millimeter gave the best fit with stability and full extension.  The distal femoral drill holes were made in the notch cut for the trochlear groove was then carried out with trials were then removed the patella was cut using the patellar cutting guide and it sized to a size  2 after drill holes have been made  The knee was irrigated with pulsatile lavage and the bony surfaces dried the tibial component was cemented into place first.  Excess cement was removed and the polyethylene insert placed with a torque screw placed with a torque screwdriver tightened.  The distal femoral component was placed and the knee was held in extension as the patellar button was clamped into place.  After the cement was set, excess cement was removed and the knee was again irrigated thoroughly thoroughly irrigated.  The tourniquet was let down and hemostasis checked with electrocautery. The arthrotomy was repaired with a heavy Quill suture,  followed by 3-0 V lock subcuticular closure, skin staples followed by incisional wound VAC and Polar Care.Marland Kitchen   PLAN OF CARE: Admit for overnight observation   PATIENT DISPOSITION:  PACU - hemodynamically stable.

## 2019-11-08 NOTE — Evaluation (Signed)
Physical Therapy Evaluation Patient Details Name: Kristy Richardson MRN: [id number] DOB: 1950-08-30 Today's Date: 11/08/2019   History of Present Illness  Pt is a 69 y.o. female s/p L TKA 8/17.  PMH includes anemia, L foot fx, LBP, h/o foot surgery, htn, stomach surgery 2012.  Clinical Impression  Prior to hospital admission, pt was independent with ambulation; lives with her husband on main level of home (2 STE no railings).  Currently pt is min assist semi-supine to sitting edge of bed; min assist with transfers; and CGA to ambulate a few feet bed to recliner with RW.  Pt requiring min assist for L LE SLR.  L knee flexion AROM to 75 degrees (L knee extension lacking 10 degrees from neutral but pt reports h/o L knee extension ROM impairment).  L knee pain 4/10 beginning of session and 8/10 end of session at rest (nurse notified of pt's request for pain medication).  Pt would benefit from skilled PT to address noted impairments and functional limitations (see below for any additional details).  Upon hospital discharge, pt would benefit from HHPT.    Follow Up Recommendations Home health PT;Supervision for mobility/OOB    Equipment Recommendations  Rolling walker with 5" wheels;3in1 (PT)    Recommendations for Other Services OT consult     Precautions / Restrictions Precautions Precautions: Fall;Knee Precaution Booklet Issued: Yes (comment) Precaution Comments: wound vac Restrictions Weight Bearing Restrictions: Yes LLE Weight Bearing: Weight bearing as tolerated      Mobility  Bed Mobility Overal bed mobility: Needs Assistance Bed Mobility: Supine to Sit     Supine to sit: Min assist;HOB elevated     General bed mobility comments: minimal assist for L LE; vc's for technique; increased effort/time to perform; assist for lines  Transfers Overall transfer level: Needs assistance Equipment used: Rolling walker (2 wheeled) Transfers: Sit to/from Stand Sit to Stand: Min assist          General transfer comment: vc's for UE/LE placement and overall technique; assist to initiate stand up to RW and control descent sitting down into recliner  Ambulation/Gait Ambulation/Gait assistance: Min guard Gait Distance (Feet): 3 Feet (bed to recliner) Assistive device: Rolling walker (2 wheeled)   Gait velocity: decreased   General Gait Details: antalgic; vc's for UE/LE placement, stepping pattern, and walker use  Stairs            Wheelchair Mobility    Modified Rankin (Stroke Patients Only)       Balance Overall balance assessment: Needs assistance Sitting-balance support: No upper extremity supported;Feet supported Sitting balance-Leahy Scale: Good Sitting balance - Comments: steady sitting reaching within BOS   Standing balance support: Single extremity supported Standing balance-Leahy Scale: Poor Standing balance comment: pt requiring at least single UE support for static standing balance                             Pertinent Vitals/Pain Pain Assessment: 0-10 Pain Score: 8  Pain Location: L knee Pain Descriptors / Indicators: Aching;Sore;Tender Pain Intervention(s): Limited activity within patient's tolerance;Monitored during session;Premedicated before session;Repositioned;Patient requesting pain meds-RN notified;Other (comment) (polar care applied and activated)  Vitals (HR and O2 on room air) stable and WFL throughout treatment session.    Home Living Family/patient expects to be discharged to:: Private residence Living Arrangements: Spouse/significant other Available Help at Discharge: Family Type of Home: House Home Access: Stairs to enter Entrance Stairs-Rails: None Entrance Stairs-Number of Steps: 2 Home  Layout: Multi-level;Able to live on main level with bedroom/bathroom Home Equipment: Cane - single point      Prior Function Level of Independence: Independent         Comments: No falls in past 6 months      Hand Dominance        Extremity/Trunk Assessment   Upper Extremity Assessment Upper Extremity Assessment: Overall WFL for tasks assessed    Lower Extremity Assessment Lower Extremity Assessment: LLE deficits/detail (R LE WFL) LLE Deficits / Details: good L quad set strength; min assist for L LE SLR; at least 3/5 AROM DF/PF LLE: Unable to fully assess due to pain    Cervical / Trunk Assessment Cervical / Trunk Assessment: Normal  Communication   Communication: No difficulties  Cognition Arousal/Alertness: Awake/alert Behavior During Therapy: WFL for tasks assessed/performed Overall Cognitive Status: Within Functional Limits for tasks assessed                                        General Comments General comments (skin integrity, edema, etc.): wound vac in place.  Nursing cleared pt for participation in physical therapy.  Pt agreeable to PT session.  Pt's husband present during session.    Exercises Total Joint Exercises Ankle Circles/Pumps: AROM;Strengthening;Both;10 reps;Supine Quad Sets: AROM;Strengthening;Both;10 reps;Supine Short Arc Quad: AAROM;Strengthening;Left;10 reps;Supine Heel Slides: AAROM;Strengthening;Left;10 reps;Supine Hip ABduction/ADduction: AAROM;Strengthening;Left;10 reps;Supine Straight Leg Raises: AAROM;Strengthening;Left;10 reps;Supine Goniometric ROM: L knee extension 10 degrees short of neutral semi-supine in bed; L knee flexion AROM to 75 degrees sitting edge of recliner   Assessment/Plan    PT Assessment Patient needs continued PT services  PT Problem List Decreased strength;Decreased range of motion;Decreased activity tolerance;Decreased balance;Decreased mobility;Decreased knowledge of use of DME;Decreased knowledge of precautions;Pain;Decreased skin integrity       PT Treatment Interventions DME instruction;Gait training;Stair training;Functional mobility training;Therapeutic activities;Therapeutic exercise;Balance  training;Patient/family education    PT Goals (Current goals can be found in the Care Plan section)  Acute Rehab PT Goals Patient Stated Goal: to go home; improve pain PT Goal Formulation: With patient Time For Goal Achievement: 11/22/19 Potential to Achieve Goals: Good    Frequency BID   Barriers to discharge        Co-evaluation               AM-PAC PT "6 Clicks" Mobility  Outcome Measure Help needed turning from your back to your side while in a flat bed without using bedrails?: A Little Help needed moving from lying on your back to sitting on the side of a flat bed without using bedrails?: A Little Help needed moving to and from a bed to a chair (including a wheelchair)?: A Little Help needed standing up from a chair using your arms (e.g., wheelchair or bedside chair)?: A Little Help needed to walk in hospital room?: A Little Help needed climbing 3-5 steps with a railing? : A Lot 6 Click Score: 17    End of Session Equipment Utilized During Treatment: Gait belt Activity Tolerance: Patient limited by pain Patient left: in chair;with call bell/phone within reach;with chair alarm set;with family/visitor present;with SCD's reapplied;Other (comment) (B heels floating via towel rolls; polar care in place and activated) Nurse Communication: Mobility status;Patient requests pain meds;Precautions;Weight bearing status PT Visit Diagnosis: Other abnormalities of gait and mobility (R26.89);Muscle weakness (generalized) (M62.81);Difficulty in walking, not elsewhere classified (R26.2);Pain Pain - Right/Left: Left Pain - part of body:  Knee    Time: 9528-4132 PT Time Calculation (min) (ACUTE ONLY): 53 min   Charges:   PT Evaluation $PT Eval Low Complexity: 1 Low PT Treatments $Therapeutic Exercise: 8-22 mins $Therapeutic Activity: 8-22 mins       Hendricks Limes, PT 11/08/19, 4:57 PM

## 2019-11-09 ENCOUNTER — Encounter: Payer: Self-pay | Admitting: Orthopedic Surgery

## 2019-11-09 LAB — BASIC METABOLIC PANEL
Anion gap: 8 (ref 5–15)
BUN: 17 mg/dL (ref 8–23)
CO2: 23 mmol/L (ref 22–32)
Calcium: 8.8 mg/dL — ABNORMAL LOW (ref 8.9–10.3)
Chloride: 107 mmol/L (ref 98–111)
Creatinine, Ser: 0.8 mg/dL (ref 0.44–1.00)
GFR calc Af Amer: >60 mL/min (ref >60)
GFR calc non Af Amer: >60 mL/min (ref >60)
Glucose, Bld: 112 mg/dL — ABNORMAL HIGH (ref 70–99)
Potassium: 3.6 mmol/L (ref 3.5–5.1)
Sodium: 138 mmol/L (ref 135–145)

## 2019-11-09 LAB — CBC
HCT: 31.5 % — ABNORMAL LOW (ref 36.0–46.0)
Hemoglobin: 10.3 g/dL — ABNORMAL LOW (ref 12.0–15.0)
MCH: 30 pg (ref 26.0–34.0)
MCHC: 32.7 g/dL (ref 30.0–36.0)
MCV: 91.8 fL (ref 80.0–100.0)
Platelets: 189 10*3/uL (ref 150–400)
RBC: 3.43 MIL/uL — ABNORMAL LOW (ref 3.87–5.11)
RDW: 12.5 % (ref 11.5–15.5)
WBC: 7.9 10*3/uL (ref 4.0–10.5)
nRBC: 0 % (ref 0.0–0.2)

## 2019-11-09 MED ORDER — MAGNESIUM HYDROXIDE 400 MG/5ML PO SUSP
30.0000 mL | Freq: Once | ORAL | Status: AC
  Administered 2019-11-09: 30 mL via ORAL
  Filled 2019-11-09: qty 30

## 2019-11-09 NOTE — Progress Notes (Signed)
Physical Therapy Treatment Patient Details Name: Kristy Richardson MRN: [id number] DOB: 1951-02-18 Today's Date: 11/09/2019    History of Present Illness Pt is a 69 y.o. female s/p L TKA 8/17.  PMH includes anemia, L foot fx, LBP, h/o foot surgery, htn, stomach surgery 2012.    PT Comments    Pt resting in bed upon PT arrival.  1/10 L knee pain beginning of session and 4/10 end of session at rest (nurse notified).  Tolerated LE ex's in bed fairly well.  Able to progress to ambulating 90 feet with RW CGA (vc's required to improve gait technique).  L knee flexion AROM to 80 degrees.  Will continue to focus on strengthening, knee ROM, progressive ambulation, and trial stairs when appropriate.   Follow Up Recommendations  Home health PT;Supervision for mobility/OOB     Equipment Recommendations  Rolling walker with 5" wheels;3in1 (PT)    Recommendations for Other Services OT consult     Precautions / Restrictions Precautions Precautions: Fall;Knee Precaution Booklet Issued: Yes (comment) Precaution Comments: wound vac Restrictions Weight Bearing Restrictions: Yes LLE Weight Bearing: Weight bearing as tolerated    Mobility  Bed Mobility Overal bed mobility: Needs Assistance Bed Mobility: Supine to Sit     Supine to sit: Min guard     General bed mobility comments: CGA for L LE for safety; vc's for technique; increased effort/time to perform  Transfers Overall transfer level: Needs assistance Equipment used: Rolling walker (2 wheeled) Transfers: Sit to/from Stand Sit to Stand: Min assist         General transfer comment: vc's for UE/LE placement and overall technique; assist to initiate stand up to RW and control descent sitting down into recliner  Ambulation/Gait Ambulation/Gait assistance: Min guard Gait Distance (Feet): 90 Feet Assistive device: Rolling walker (2 wheeled)   Gait velocity: decreased   General Gait Details: antalgic; vc's for increasing UE  support through RW to offweight L LE and improve quality of R LE stepping forward; step to to partial step through gait pattern   Stairs             Wheelchair Mobility    Modified Rankin (Stroke Patients Only)       Balance Overall balance assessment: Needs assistance Sitting-balance support: No upper extremity supported;Feet supported Sitting balance-Leahy Scale: Good Sitting balance - Comments: steady sitting reaching within BOS   Standing balance support: Single extremity supported Standing balance-Leahy Scale: Poor Standing balance comment: pt requiring at least single UE support for static standing balance                            Cognition Arousal/Alertness: Awake/alert Behavior During Therapy: WFL for tasks assessed/performed Overall Cognitive Status: Within Functional Limits for tasks assessed                                        Exercises Total Joint Exercises Ankle Circles/Pumps: AROM;Strengthening;Both;10 reps;Supine Quad Sets: AROM;Strengthening;Both;10 reps;Supine Short Arc Quad: AAROM;Strengthening;Left;10 reps;Supine Heel Slides: AAROM;Strengthening;Left;10 reps;Supine Hip ABduction/ADduction: AAROM;Strengthening;Left;10 reps;Supine Straight Leg Raises: AAROM;Strengthening;Left;10 reps;Supine Goniometric ROM: L knee extension 10 degrees short of neutral semi-supine in bed; L knee flexion AROM to 80 degrees sitting edge of recliner    General Comments General comments (skin integrity, edema, etc.): wound vac in place.  Nursing cleared pt for participation in physical therapy.  Pt agreeable to  PT session.      Pertinent Vitals/Pain Pain Assessment: 0-10 Pain Score: 4  Pain Location: L knee Pain Descriptors / Indicators: Aching;Sore;Tender Pain Intervention(s): Limited activity within patient's tolerance;Monitored during session;Premedicated before session;Repositioned;Other (comment) (polar care applied and activated)   Vitals (HR and O2 on room air) stable and WFL throughout treatment session.    Home Living                      Prior Function            PT Goals (current goals can now be found in the care plan section) Acute Rehab PT Goals Patient Stated Goal: to go home; improve pain PT Goal Formulation: With patient Time For Goal Achievement: 11/22/19 Potential to Achieve Goals: Good Progress towards PT goals: Progressing toward goals    Frequency    BID      PT Plan Current plan remains appropriate    Co-evaluation              AM-PAC PT "6 Clicks" Mobility   Outcome Measure  Help needed turning from your back to your side while in a flat bed without using bedrails?: A Little Help needed moving from lying on your back to sitting on the side of a flat bed without using bedrails?: A Little Help needed moving to and from a bed to a chair (including a wheelchair)?: A Little Help needed standing up from a chair using your arms (e.g., wheelchair or bedside chair)?: A Little Help needed to walk in hospital room?: A Little Help needed climbing 3-5 steps with a railing? : A Little 6 Click Score: 18    End of Session Equipment Utilized During Treatment: Gait belt Activity Tolerance: Patient limited by pain Patient left: in chair;with call bell/phone within reach;with chair alarm set;with family/visitor present;with SCD's reapplied;Other (comment) (B heels floating via towel rolls; polar care in place and activated) Nurse Communication: Mobility status;Precautions;Weight bearing status PT Visit Diagnosis: Other abnormalities of gait and mobility (R26.89);Muscle weakness (generalized) (M62.81);Difficulty in walking, not elsewhere classified (R26.2);Pain Pain - Right/Left: Left Pain - part of body: Knee     Time: 0867-6195 PT Time Calculation (min) (ACUTE ONLY): 45 min  Charges:  $Gait Training: 8-22 mins $Therapeutic Exercise: 8-22 mins $Therapeutic Activity: 8-22  mins                    Hendricks Limes, PT 11/09/19, 12:54 PM

## 2019-11-09 NOTE — TOC Initial Note (Signed)
Transition of Care [facility]) - Initial/Assessment Note    Patient Details  Name: Kristy Richardson MRN: [id number] Date of Birth: 1950-12-06  Transition of Care [facility] CM/SW Contact:    Shelbie Ammons, RN Phone Number: 11/09/2019, 9:10 AM  Clinical Narrative:   RNCM met with patient at bedside. Patient is alert and pleasant and reports to feeling well today. She reports that she has already gotten notification from [facility] that they will be providing home health service and she is fine with this plan. She is also agreeable to rolling walker and 3N1 being provided.  RNCM reached out to [place] with [facility] to assure she has patient listed.  RNCM reached out to [facility] with [employer] and he will provide needed equipment.               Expected Discharge Plan: [place] Barriers to Discharge: No Barriers Identified   Patient Goals and CMS Choice        Expected Discharge Plan and Services Expected Discharge Plan: [place] Choice: [place] Two arrangements for the past 2 months: Single Family Home                 DME Arranged: 3-N-1, Walker rolling DME Agency: [employer] Date DME Agency Contacted: 11/09/19 Time DME Agency Contacted: [phone] Representative spoke with at DME Agency: [place]: PT [place]: [facility] (formerly [employer]) Date [place]: 11/09/19 Time [place]: [phone] Representative spoke with at [place]: [place] Arrangements/Services Living arrangements for the past 2 months: [place] Lives with:: Spouse Patient language and need for interpreter reviewed:: Yes Do you feel safe going back to the place where you live?: Yes      Need for Family Participation in Patient Care: Yes (Comment) Care giver support system in place?: Yes (comment)   Criminal Activity/Legal Involvement Pertinent to Current Situation/Hospitalization: No - Comment as  needed  Activities of Daily Living Home Assistive Devices/Equipment: Eyeglasses ADL Screening (condition at time of admission) Patient's cognitive ability adequate to safely complete daily activities?: Yes Is the patient deaf or have difficulty hearing?: No Does the patient have difficulty seeing, even when wearing glasses/contacts?: No Does the patient have difficulty concentrating, remembering, or making decisions?: No Patient able to express need for assistance with ADLs?: Yes Does the patient have difficulty dressing or bathing?: No Independently performs ADLs?: Yes (appropriate for developmental age) Does the patient have difficulty walking or climbing stairs?: No Weakness of Legs: None Weakness of Arms/Hands: None  Permission Sought/Granted                  Emotional Assessment Appearance:: Appears stated age Attitude/Demeanor/Rapport: Engaged, Gracious Affect (typically observed): Appropriate, Calm Orientation: : Oriented to Self, Oriented to Place, Oriented to  Time, Oriented to Situation Alcohol / Substance Use: Not Applicable Psych Involvement: No (comment)  Admission diagnosis:  S/P TKR (total knee replacement) using cement, left [Z96.652] Patient Active Problem List   Diagnosis Date Noted  . S/P TKR (total knee replacement) using cement, left 11/08/2019   PCP:  Leonel Ramsay, MD Pharmacy:   [id number] - [place], [place] - [address] [address] Phone: [phone] Fax: [facility] - [place], [place] [address] Phone: [phone] Fax: [phone]  Social Determinants of Health (SDOH) Interventions    Readmission Risk Interventions No flowsheet data found.

## 2019-11-09 NOTE — Progress Notes (Signed)
   Subjective: 1 Day Post-Op Procedure(s) (LRB): TOTAL KNEE ARTHROPLASTY (Left) APPLICATION OF WOUND VAC (Left) Patient reports pain as 2 on 0-10 scale.   Patient is well, and has had no acute complaints or problems Denies any CP, SOB, ABD pain. We will continue therapy today.  Plan is to go Home after hospital stay.  Objective: Vital signs in last 24 hours: Temp:  [97 F (36.1 C)-98.5 F (36.9 C)] 98 F (36.7 C) (08/18 0728) Pulse Rate:  [53-66] 59 (08/18 0728) Resp:  [14-19] 16 (08/18 0728) BP: (98-147)/(69-94) 122/81 (08/18 0728) SpO2:  [93 %-100 %] 99 % (08/18 0728)  Intake/Output from previous day: 08/17 0701 - 08/18 0700 In: 2326.7 [P.O.:780; I.V.:1346.7; IV Piggyback:200] Out: 1525 [Urine:1425; Blood:100] Intake/Output this shift: No intake/output data recorded.  Recent Labs    11/08/19 1149 11/09/19 0541  HGB 11.6* 10.3*   Recent Labs    11/08/19 1149 11/09/19 0541  WBC 10.6* 7.9  RBC 3.87 3.43*  HCT 35.7* 31.5*  PLT 205 189   Recent Labs    11/08/19 1149 11/09/19 0541  NA  --  138  K  --  3.6  CL  --  107  CO2  --  23  BUN  --  17  CREATININE 0.88 0.80  GLUCOSE  --  112*  CALCIUM  --  8.8*   No results for input(s): LABPT, INR in the last 72 hours.  EXAM General - Patient is Alert, Appropriate and Oriented Extremity - Neurovascular intact Sensation intact distally Intact pulses distally Dorsiflexion/Plantar flexion intact No cellulitis present Compartment soft Dressing - dressing C/D/I and no drainage, Praveena intact without drainage Motor Function - intact, moving foot and toes well on exam.   Past Medical History:  Diagnosis Date  . Anemia   . 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 Dysfunctional uterine bleeding   . Foot fracture, left   . H/O cesarean section    x 2  . Hyperlipidemia   . Hypertension     Assessment/Plan:   1 Day Post-Op Procedure(s) (LRB): TOTAL KNEE ARTHROPLASTY  (Left) APPLICATION OF WOUND VAC (Left) Active Problems:   S/P TKR (total knee replacement) using cement, left  Estimated body mass index is 34.41 kg/m as calculated from the following:   Height as of this encounter: 5\' 3"  (1.6 m).   Weight as of this encounter: 88.1 kg. Advance diet Up with therapy  Work on bowel movement Labs are stable Vital signs stable Pain well controlled Care management to assist with discharge  DVT Prophylaxis - Lovenox, Foot Pumps and TED hose Weight-Bearing as tolerated to left leg   T. , PA-C [facility] 11/09/2019, 8:21 AM

## 2019-11-09 NOTE — Anesthesia Postprocedure Evaluation (Signed)
Anesthesia Post Note  Patient: Kristy Richardson  Procedure(s) Performed: TOTAL KNEE ARTHROPLASTY (Left Knee) APPLICATION OF WOUND VAC (Left Knee)  Patient location during evaluation: Nursing Unit Anesthesia Type: Spinal Level of consciousness: awake, awake and alert and oriented Pain management: pain level controlled Vital Signs Assessment: post-procedure vital signs reviewed and stable Respiratory status: respiratory function stable Cardiovascular status: stable Postop Assessment: no headache, no backache, no apparent nausea or vomiting, patient able to bend at knees, adequate PO intake and able to ambulate Anesthetic complications: no   No complications documented.   Last Vitals:  Vitals:   11/09/19 0437 11/09/19 0728  BP: 122/75 122/81  Pulse: (!) 55 (!) 59  Resp: 16 16  Temp: 36.5 C 36.7 C  SpO2: 98% 99%    Last Pain:  Vitals:   11/09/19 0728  TempSrc: Oral  PainSc:                  Zachary George

## 2019-11-09 NOTE — Evaluation (Addendum)
Occupational Therapy Evaluation Patient Details Name: Kristy Richardson MRN: [id number] DOB: 1950/06/04 Today's Date: 11/09/2019    History of Present Illness Pt is a 69 y.o. female s/p L TKA 8/17.  PMH includes anemia, L foot fx, LBP, h/o foot surgery, htn, stomach surgery 2012.   Clinical Impression   Pt was seen for OT evaluation this date. Prior to hospital admission, pt was Indep with self care ADLs/ADL mobility. Pt lives with spouse in multi level home with 2 STE, but is able to stay on main floor. Currently pt demonstrates impairments as described below (See OT problem list) which functionally limit her ability to perform ADL/self-care tasks. Pt currently requires MIN A for LB ADLs and CGA/MIN A for ADL transfers with RW.  Pt would benefit from skilled OT to address noted impairments and functional limitations (see below for any additional details) in order to maximize safety and independence while minimizing falls risk and caregiver burden. Upon hospital discharge, do not anticipate further need for skilled OT, anticipate that all education can be completed in acute setting for success with ADL modification and safety.     Follow Up Recommendations  No OT follow up    Equipment Recommendations  3 in 1 bedside commode;Other (comment) (2WW and grabber/reacher)    Recommendations for Other Services       Precautions / Restrictions Precautions Precautions: Fall;Knee Precaution Booklet Issued: Yes (comment) Precaution Comments: wound vac Restrictions Weight Bearing Restrictions: Yes LLE Weight Bearing: Weight bearing as tolerated      Mobility Bed Mobility     General bed mobility comments: pt up to chair pre/post session  Transfers Overall transfer level: Needs assistance Equipment used: Rolling walker (2 wheeled) Transfers: Sit to/from Stand Sit to Stand: Min guard;Min assist         General transfer comment: increased time and slight boost from recliner, but pt demos  good foot placement for t/f. Requires one cue for hand placement with RW.    Balance Overall balance assessment: Needs assistance Sitting-balance support: No upper extremity supported;Feet supported Sitting balance-Leahy Scale: Good Sitting balance - Comments: steady sitting reaching within BOS   Standing balance support: Single extremity supported Standing balance-Leahy Scale: Poor Standing balance comment: requires CGA and B UE support to sustain static balance.                           ADL either performed or assessed with clinical judgement   ADL                                         General ADL Comments: MIN A for LB dressing d/t some lingering knee pain, but demos good flexibility in hip to don/doff socks in seated position. Requires increased time and task initiated.     Vision Baseline Vision/History: Wears glasses Patient Visual Report: No change from baseline       Perception     Praxis      Pertinent Vitals/Pain Pain Assessment: 0-10 Pain Score: 6  Pain Location: L knee Pain Descriptors / Indicators: Sore;Guarding Pain Intervention(s): Limited activity within patient's tolerance;Monitored during session     Hand Dominance     Extremity/Trunk Assessment Upper Extremity Assessment Upper Extremity Assessment: Overall WFL for tasks assessed   Lower Extremity Assessment Lower Extremity Assessment: Defer to PT evaluation;LLE deficits/detail LLE: Unable to fully  assess due to pain   Cervical / Trunk Assessment Cervical / Trunk Assessment: Normal   Communication Communication Communication: No difficulties   Cognition Arousal/Alertness: Awake/alert Behavior During Therapy: WFL for tasks assessed/performed Overall Cognitive Status: Within Functional Limits for tasks assessed                                     General Comments  wound vac in place    Exercises Other Exercises Other Exercises: OT facilitates  ed re: LB ADLs with AE as needed, polar care mgt, compression stocking mgt. Pt with good understanding, could benefit from f/u for carryover.   Shoulder Instructions      Home Living Family/patient expects to be discharged to:: Private residence Living Arrangements: Spouse/significant other Available Help at Discharge: Family Type of Home: House Home Access: Stairs to enter [employer] of Steps: 2 Entrance Stairs-Rails: None Home Layout: Multi-level;Able to live on main level with bedroom/bathroom     Bathroom Shower/Tub: Producer, television/film/video: Standard     Home Equipment: Cane - single point          Prior Functioning/Environment Level of Independence: Independent        Comments: No falls in past 6 months        OT Problem List: Decreased strength;Decreased range of motion;Decreased activity tolerance;Impaired balance (sitting and/or standing);Decreased knowledge of use of DME or AE      OT Treatment/Interventions: Self-care/ADL training;Therapeutic exercise;DME and/or AE instruction;Therapeutic activities;Balance training;Patient/family education    OT Goals(Current goals can be found in the care plan section) Acute Rehab OT Goals Patient Stated Goal: to go home; improve pain OT Goal Formulation: With patient Time For Goal Achievement: 11/23/19 Potential to Achieve Goals: Good ADL Goals Pt Will Perform Lower Body Dressing: with set-up;with adaptive equipment;sit to/from stand Pt Will Transfer to Toilet: with supervision;ambulating;bedside commode (RW for fxl mobility to restroom with BSC over standard commode)  OT Frequency: Min 2X/week   Barriers to D/C:            Co-evaluation              AM-PAC OT "6 Clicks" Daily Activity     Outcome Measure Help from another person eating meals?: None Help from another person taking care of personal grooming?: None Help from another person toileting, which includes using toliet, bedpan,  or urinal?: A Little Help from another person bathing (including washing, rinsing, drying)?: A Little Help from another person to put on and taking off regular upper body clothing?: None Help from another person to put on and taking off regular lower body clothing?: A Little 6 Click Score: 21   End of Session Equipment Utilized During Treatment: Gait belt;Rolling walker Nurse Communication: Mobility status  Activity Tolerance: Patient tolerated treatment well Patient left: in chair;with call bell/phone within reach;with chair alarm set  OT Visit Diagnosis: Unsteadiness on feet (R26.81);Other abnormalities of gait and mobility (R26.89);Muscle weakness (generalized) (M62.81)                Time: 1610-9604 OT Time Calculation (min): 26 min Charges:  OT General Charges $OT Visit: 1 Visit OT Evaluation $OT Eval Moderate Complexity: 1 Mod OT Treatments $Self Care/Home Management : 8-22 mins  Rejeana Brock, MS, OTR/L ascom [phone] 11/09/19, 2:32 PM

## 2019-11-09 NOTE — Progress Notes (Signed)
Physical Therapy Treatment Patient Details Name: Kristy Richardson MRN: [id number] DOB: [birth year] Today's Date: 11/09/2019    History of Present Illness Pt is a 69 y.o. female s/p L TKA 8/17.  PMH includes anemia, L foot fx, LBP, h/o foot surgery, htn, stomach surgery 2012.    PT Comments    Pt resting in recliner upon PT arrival and requesting to toilet.  Tolerated sitting LE ex's fairly well with assist for L LE and then pt ambulated about 10 feet with RW to bathroom to toilet.  Then pt able to ambulate 150 feet with RW CGA.  Reviewed polar care use/management with pt and pt's husband d/t questions regarding use: both appearing with good understanding.  5/10 L knee pain beginning of session and 9/10 end of session at rest (although pt reporting walking made the pain better and rest made it hurt more--nurse notified of pt's pain).  Will continue to focus on strengthening, knee ROM, and progressive functional mobility during hospital stay; plan to trial stairs next session as appropriate.    Follow Up Recommendations  Home health PT;Supervision for mobility/OOB     Equipment Recommendations  Rolling walker with 5" wheels;3in1 (PT)    Recommendations for Other Services OT consult     Precautions / Restrictions Precautions Precautions: Fall;Knee Precaution Booklet Issued: Yes (comment) Precaution Comments: wound vac Restrictions Weight Bearing Restrictions: Yes LLE Weight Bearing: Weight bearing as tolerated    Mobility  Bed Mobility Overal bed mobility: Needs Assistance Bed Mobility: Sit to Supine       Sit to supine: Min assist (bed flat)   General bed mobility comments: assist for L LE into bed  Transfers Overall transfer level: Needs assistance Equipment used: Rolling walker (2 wheeled) Transfers: Sit to/from [employer] Sit to Stand: Min guard (x1 trial from recliner and x1 trial from [facility] over toilet) Stand pivot transfers: Min guard (to [facility] over  toilet)       General transfer comment: minimal vc's for UE/LE placement and overall technique  Ambulation/Gait Ambulation/Gait assistance: Min guard Gait Distance (Feet): 150 Feet Assistive device: Rolling walker (2 wheeled)   Gait velocity: decreased   General Gait Details: decreased stance time L LE; increased UE support through RW to offweight L LE during R LE advancement; step to to partial step through gait pattern   Stairs             Wheelchair Mobility    Modified Rankin (Stroke Patients Only)       Balance Overall balance assessment: Needs assistance Sitting-balance support: No upper extremity supported;Feet supported Sitting balance-Leahy Scale: Good Sitting balance - Comments: steady sitting reaching within BOS   Standing balance support: No upper extremity supported Standing balance-Leahy Scale: Good Standing balance comment: steady standing washing hands at sink                            Cognition Arousal/Alertness: Awake/alert Behavior During Therapy: WFL for tasks assessed/performed Overall Cognitive Status: Within Functional Limits for tasks assessed                                        Exercises Total Joint Exercises Long Arc Quad: AROM;Right;AAROM;Left;Strengthening;10 reps;Seated General Exercises - Lower Extremity Hip Flexion/Marching: AROM;Right;AAROM;Left;Strengthening;10 reps;Seated    General Comments General comments (skin integrity, edema, etc.): wound vac in place.  Pt  agreeable to PT session; pt's husband present during session.      Pertinent Vitals/Pain Pain Assessment: 0-10 Pain Score: 9  Pain Location: L knee Pain Descriptors / Indicators: Tender;Sore Pain Intervention(s): Limited activity within patient's tolerance;Monitored during session;Premedicated before session;Repositioned;Patient requesting pain meds-RN notified;Other (comment) (polar care applied and activated)    Home Living        Prior Function        PT Goals (current goals can now be found in the care plan section) Acute Rehab PT Goals Patient Stated Goal: to go home; improve pain PT Goal Formulation: With patient Time For Goal Achievement: 11/22/19 Potential to Achieve Goals: Good Progress towards PT goals: Progressing toward goals    Frequency    BID      PT Plan Current plan remains appropriate    Co-evaluation              AM-PAC PT "6 Clicks" Mobility   Outcome Measure  Help needed turning from your back to your side while in a flat bed without using bedrails?: A Little Help needed moving from lying on your back to sitting on the side of a flat bed without using bedrails?: A Little Help needed moving to and from a bed to a chair (including a wheelchair)?: A Little Help needed standing up from a chair using your arms (e.g., wheelchair or bedside chair)?: A Little Help needed to walk in hospital room?: A Little Help needed climbing 3-5 steps with a railing? : A Little 6 Click Score: 18    End of Session Equipment Utilized During Treatment: Gait belt Activity Tolerance: Patient limited by pain Patient left: in bed;with call bell/phone within reach;with bed alarm set;with family/visitor present;with SCD's reapplied;Other (comment) (B heels floating via towel rolls; polar care in place and activated) Nurse Communication: Mobility status;Precautions;Weight bearing status;Patient requests pain meds PT Visit Diagnosis: Other abnormalities of gait and mobility (R26.89);Muscle weakness (generalized) (M62.81);Difficulty in walking, not elsewhere classified (R26.2);Pain Pain - Right/Left: Left Pain - part of body: Knee     Time: 3545-6256 PT Time Calculation (min) (ACUTE ONLY): 55 min  Charges:  $Gait Training: 8-22 mins $Therapeutic Exercise: 8-22 mins $Therapeutic Activity: 23-37 mins                     Hendricks Limes, PT 11/09/19, 5:04 PM

## 2019-11-10 MED ORDER — MAGNESIUM HYDROXIDE 400 MG/5ML PO SUSP
30.0000 mL | Freq: Once | ORAL | Status: AC
  Administered 2019-11-10: 30 mL via ORAL
  Filled 2019-11-10: qty 30

## 2019-11-10 MED ORDER — ENOXAPARIN SODIUM 40 MG/0.4ML ~~LOC~~ SOLN: 40.0000 mg | SUBCUTANEOUS | 0 refills | Status: DC

## 2019-11-10 MED ORDER — TRAMADOL HCL 50 MG PO TABS: 50.0000 mg | ORAL_TABLET | Freq: Four times a day (QID) | ORAL | 0 refills | Status: DC | PRN

## 2019-11-10 MED ORDER — HYDROCODONE-ACETAMINOPHEN 5-325 MG PO TABS: 1.0000 | ORAL_TABLET | ORAL | 0 refills | Status: DC | PRN

## 2019-11-10 NOTE — Progress Notes (Signed)
Occupational Therapy Treatment Patient Details Name: Kristy Richardson MRN: [id number] DOB: Jan 02, 1951 Today's Date: 11/10/2019    History of present illness Pt is a 69 y.o. female s/p L TKA 8/17.  PMH includes anemia, L foot fx, LBP, h/o foot surgery, htn, stomach surgery 2012.   OT comments  Pt reports that she is feeling much better today, is eager to go home (esp to see her grandchildren), and feels confident in her ability to manage knee precautions and ADL. Pt transferred from EOB to recliner with supervision and was able to doff and don sock independently. OTs provided post-op education to pt and spouse, including polar care mgmt, compression stocking mgmt, bathing, DME, knee precautions. Both verbalized understanding and were able to provide teach-back. Left printed educational materials with pt. Pt is progressing well post surgery, has acquired needed DME, and will have supportive family situation at home. Upon discharge, no further OT recommended.   Follow Up Recommendations  No OT follow up    Equipment Recommendations  3 in 1 bedside commode;Other (comment)    Recommendations for Other Services      Precautions / Restrictions Precautions Precautions: Fall;Knee Precaution Booklet Issued: Yes (comment) Precaution Comments: wound vac Restrictions Weight Bearing Restrictions: Yes LLE Weight Bearing: Weight bearing as tolerated       Mobility Bed Mobility Overal bed mobility: Independent Bed Mobility: Supine to Sit     Supine to sit: Supervision     General bed mobility comments: pt was in recliner pre/post session  Transfers Overall transfer level: Modified independent Equipment used: Rolling walker (2 wheeled) Transfers: Sit to/from Stand Sit to Stand: Supervision Stand pivot transfers: Supervision       General transfer comment: Pt was able to STS from recliner 4 x throughout session with CGA only for safety.    Balance Overall balance assessment: Needs  assistance Sitting-balance support: No upper extremity supported;Feet supported Sitting balance-Leahy Scale: Good Sitting balance - Comments: steady sitting reaching within BOS   Standing balance support: No upper extremity supported Standing balance-Leahy Scale: Good Standing balance comment: No LOB in standing but does require BUE support with dynamic/functioanl standing task                           ADL either performed or assessed with clinical judgement   ADL                                         General ADL Comments: MIN A for LB dressing d/t some lingering knee pain, but demos good flexibility in hip to don/doff socks in seated position. Requires increased time and task initiated.     Vision Baseline Vision/History: Wears glasses Patient Visual Report: No change from baseline     Perception     Praxis      Cognition Arousal/Alertness: Awake/alert Behavior During Therapy: WFL for tasks assessed/performed Overall Cognitive Status: Within Functional Limits for tasks assessed                                 General Comments: Pt was at A and O x 4         Exercises Exercises: Total Joint Other Exercises Other Exercises: Provided education to both pt and spouse on polar care, compression stocking donning and doffing,  bathing, DME, knee precautions. Both expressed understanding and were able to teach back.   Shoulder Instructions       General Comments      Pertinent Vitals/ Pain       Pain Assessment: 0-10 Pain Score: 4  (3/10 pain in bed; 4-5/10 pain with movement) Pain Location: L knee Pain Descriptors / Indicators: Tender;Sore Pain Intervention(s): Premedicated before session;Ice applied;Limited activity within patient's tolerance;Monitored during session;Repositioned  Home Living   Living Arrangements: Spouse/significant other                                      Prior Functioning/Environment               Frequency  Min 2X/week        Progress Toward Goals  OT Goals(current goals can now be found in the care plan section)  Progress towards OT goals: Progressing toward goals  Acute Rehab OT Goals Patient Stated Goal: to go home; improve pain OT Goal Formulation: With patient/family Time For Goal Achievement: 11/10/19 Potential to Achieve Goals: Good  Plan Discharge plan remains appropriate    Co-evaluation                 AM-PAC OT "6 Clicks" Daily Activity     Outcome Measure   Help from another person eating meals?: None Help from another person taking care of personal grooming?: None Help from another person toileting, which includes using toliet, bedpan, or urinal?: A Little Help from another person bathing (including washing, rinsing, drying)?: A Little Help from another person to put on and taking off regular upper body clothing?: None Help from another person to put on and taking off regular lower body clothing?: A Little 6 Click Score: 21    End of Session Equipment Utilized During Treatment: Rolling walker  OT Visit Diagnosis: Unsteadiness on feet (R26.81);Other abnormalities of gait and mobility (R26.89);Muscle weakness (generalized) (M62.81)   Activity Tolerance Patient tolerated treatment well   Patient Left in chair;with call bell/phone within reach;with chair alarm set;with family/visitor present   Nurse Communication          Time: 3419-6222 OT Time Calculation (min): 38 min  Charges: OT General Charges $OT Visit: 1 Visit OT Treatments $Self Care/Home Management : 38-52 mins  Latina Craver, PhD, MS, OTR/L ascom [phone] 11/10/19, 12:07 PM

## 2019-11-10 NOTE — Progress Notes (Signed)
Aware of discharge today with home health. MOM given

## 2019-11-10 NOTE — Discharge Instructions (Signed)

## 2019-11-10 NOTE — Progress Notes (Signed)
Discharge teaching complete with patient and her husband. Understands instructions and has copy

## 2019-11-10 NOTE — Discharge Summary (Signed)
Physician Discharge Summary  Patient ID: Kristy Richardson MRN: [id number] DOB/AGE: 70/15/1952 70 y.o.  Admit date: 11/08/2019 Discharge date: 11/10/2019  Admission Diagnoses:  S/P TKR (total knee replacement) using cement, left [Z96.652]   Discharge Diagnoses: Patient Active Problem List   Diagnosis Date Noted  . S/P TKR (total knee replacement) using cement, left 11/08/2019    Past Medical History:  Diagnosis Date  . Anemia   . 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 Dysfunctional uterine bleeding   . Foot fracture, left   . H/O cesarean section    x 2  . Hyperlipidemia   . Hypertension      Transfusion: None   Consultants (if any):   Discharged Condition: Improved  Hospital Course: Kristy Richardson is an 69 y.o. female who was admitted 11/08/2019 with a diagnosis of left knee osteoarthritis and went to the operating room on 11/08/2019 and underwent the above named procedures.    Surgeries: Procedure(s): TOTAL KNEE ARTHROPLASTY APPLICATION OF WOUND VAC on 11/08/2019 Patient tolerated the surgery well. Taken to PACU where she was stabilized and then transferred to the orthopedic floor.  Started on Lovenox 30 mg q 12 hrs. Foot pumps applied bilaterally at 80 mm. Heels elevated on bed with rolled towels. No evidence of DVT. Negative Homan. Physical therapy started on day #1 for gait training and transfer. OT started day #1 for ADL and assisted devices.  Patient's foley was d/c on day #1. Patient's IV was d/c on day #2.  On post op day #2 patient was stable and ready for discharge to home with home health PT.  Implants: Medacta GMK sphere system with for leftfemur, 3 T4 I lefttibia with short stem and 53mm insert. Size 2patella, all components cemented  She was given perioperative antibiotics:  Anti-infectives (From admission, onward)   Start     Dose/Rate Route Frequency Ordered Stop   11/08/19 1330  ceFAZolin (ANCEF) IVPB 2g/100  mL premix        2 g 200 mL/hr over 30 Minutes Intravenous Every 6 hours 11/08/19 1136 11/08/19 2144   11/08/19 0626  ceFAZolin (ANCEF) 2-4 GM/100ML-% IVPB       Note to Pharmacy: Harriette Bouillon   : cabinet override      11/08/19 0626 11/08/19 2144   11/08/19 0615  ceFAZolin (ANCEF) IVPB 2g/100 mL premix        2 g 200 mL/hr over 30 Minutes Intravenous On call to O.R. 11/08/19 9518 11/08/19 0739    .  She was given sequential compression devices, early ambulation, and Lovenox, teds for DVT prophylaxis.  She benefited maximally from the hospital stay and there were no complications.    Recent vital signs:  Vitals:   11/09/19 2312 11/10/19 0746  BP: 120/72 (!) 134/94  Pulse: 65 68  Resp: 18 16  Temp: 98.3 F (36.8 C) 98.1 F (36.7 C)  SpO2: 97% 96%    Recent laboratory studies:  Lab Results  Component Value Date   HGB 10.3 (L) 11/09/2019   HGB 11.6 (L) 11/08/2019   HGB 12.9 11/01/2019   Lab Results  Component Value Date   WBC 7.9 11/09/2019   PLT 189 11/09/2019   No results found for: INR Lab Results  Component Value Date   NA 138 11/09/2019   K 3.6 11/09/2019   CL 107 11/09/2019   CO2 23 11/09/2019   BUN 17 11/09/2019   CREATININE 0.80 11/09/2019  GLUCOSE 112 (H) 11/09/2019    Discharge Medications:   Allergies as of 11/10/2019   No Known Allergies     Medication List    TAKE these medications   amLODipine 10 MG tablet Commonly known as: NORVASC Take 10 mg by mouth daily.   cetirizine 10 MG tablet Commonly known as: ZYRTEC Take 10 mg by mouth daily.   enoxaparin 40 MG/0.4ML injection Commonly known as: Lovenox Inject 0.4 mLs (40 mg total) into the skin daily for 14 days.   HYDROcodone-acetaminophen 5-325 MG tablet Commonly known as: NORCO/VICODIN Take 1-2 tablets by mouth every 4 (four) hours as needed for moderate pain (pain score 4-6).   lisinopril 40 MG tablet Commonly known as: ZESTRIL Take 40 mg by mouth daily.   loratadine 10 MG  tablet Commonly known as: CLARITIN Take 10 mg by mouth daily. Allergy Relief   potassium chloride 10 MEQ tablet Commonly known as: KLOR-CON Take 10 mEq by mouth daily.   traMADol 50 MG tablet Commonly known as: ULTRAM Take 1 tablet (50 mg total) by mouth every 6 (six) hours as needed. What changed:   when to take this  reasons to take this   Vitamin D 50 MCG (2000 UT) tablet Take 2,000 Units by mouth daily.            Durable Medical Equipment  (From admission, onward)         Start     Ordered   11/08/19 1137  DME Walker rolling  Once       Question Answer Comment  Walker: With 5 Inch Wheels   Patient needs a walker to treat with the following condition S/P TKR (total knee replacement) using cement, left      11/08/19 1136   11/08/19 1137  DME 3 n 1  Once        11/08/19 1136   11/08/19 1137  DME Bedside commode  Once       Question:  Patient needs a bedside commode to treat with the following condition  Answer:  S/P TKR (total knee replacement) using cement, left   11/08/19 1136          Diagnostic Studies: DG Knee 1-2 Views Left  Result Date: 11/08/2019 CLINICAL DATA:  Knee replaced EXAM: LEFT KNEE - 1-2 VIEW COMPARISON:  CT 09/15/2019 FINDINGS: Total knee arthroplasty. No periprosthetic fracture or joint subluxation. Soft tissue and joint gas, expected. Generalized osteopenia IMPRESSION: Total knee arthroplasty without complicating feature. Electronically Signed   By: Marnee Spring M.D.   On: 11/08/2019 09:40    Disposition:      Follow-up Information    Evon Slack, PA-C Follow up in 2 week(s).   Specialties: Orthopedic Surgery, Emergency Medicine Contact information: [address] [facility] [place] [address] [phone]                Signed: Amador Cunas [facility] 11/10/2019, 7:48 AM

## 2019-11-10 NOTE — Plan of Care (Signed)
Went over discharge instructions with patient. Aware of medications to pick up.

## 2019-11-10 NOTE — Progress Notes (Signed)
   Subjective: 2 Days Post-Op Procedure(s) (LRB): TOTAL KNEE ARTHROPLASTY (Left) APPLICATION OF WOUND VAC (Left) Patient reports pain as moderate.   Patient is well, and has had no acute complaints or problems Denies any CP, SOB, ABD pain. We will continue therapy today.  Plan is to go Home after hospital stay.  Objective: Vital signs in last 24 hours: Temp:  [98 F (36.7 C)-98.3 F (36.8 C)] 98.3 F (36.8 C) (08/18 2312) Pulse Rate:  [63-65] 65 (08/18 2312) Resp:  [18] 18 (08/18 2312) BP: (120-144)/(72-79) 120/72 (08/18 2312) SpO2:  [97 %] 97 % (08/18 2312)  Intake/Output from previous day: 08/18 0701 - 08/19 0700 In: 240 [P.O.:240] Out: 0  Intake/Output this shift: No intake/output data recorded.  Recent Labs    11/08/19 1149 11/09/19 0541  HGB 11.6* 10.3*   Recent Labs    11/08/19 1149 11/09/19 0541  WBC 10.6* 7.9  RBC 3.87 3.43*  HCT 35.7* 31.5*  PLT 205 189   Recent Labs    11/08/19 1149 11/09/19 0541  NA  --  138  K  --  3.6  CL  --  107  CO2  --  23  BUN  --  17  CREATININE 0.88 0.80  GLUCOSE  --  112*  CALCIUM  --  8.8*   No results for input(s): LABPT, INR in the last 72 hours.  EXAM General - Patient is Alert, Appropriate and Oriented Extremity - Neurovascular intact Sensation intact distally Intact pulses distally Dorsiflexion/Plantar flexion intact No cellulitis present Compartment soft Dressing - dressing C/D/I and no drainage, Praveena intact without drainage Motor Function - intact, moving foot and toes well on exam.   Past Medical History:  Diagnosis Date  . Anemia   . 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 Dysfunctional uterine bleeding   . Foot fracture, left   . H/O cesarean section    x 2  . Hyperlipidemia   . Hypertension     Assessment/Plan:   2 Days Post-Op Procedure(s) (LRB): TOTAL KNEE ARTHROPLASTY (Left) APPLICATION OF WOUND VAC (Left) Active Problems:   S/P TKR (total knee  replacement) using cement, left  Estimated body mass index is 34.41 kg/m as calculated from the following:   Height as of this encounter: 5\' 3"  (1.6 m).   Weight as of this encounter: 88.1 kg. Advance diet Up with therapy  Work on bowel movement Vital signs stable Pain well controlled Care management to assist with discharge to home with home health PT today pending bowel movement and completion of PT goals.  DVT Prophylaxis - Lovenox, Foot Pumps and TED hose Weight-Bearing as tolerated to left leg   T. , PA-C [facility] 11/10/2019, 7:43 AM

## 2019-11-10 NOTE — Plan of Care (Signed)
  Problem: Education: Goal: Knowledge of the prescribed therapeutic regimen will improve Outcome: Progressing   Problem: Activity: Goal: Ability to avoid complications of mobility impairment will improve Outcome: Progressing   Problem: Clinical Measurements: Goal: Postoperative complications will be avoided or minimized Outcome: Progressing   Problem: Pain Management: Goal: Pain level will decrease with appropriate interventions Outcome: Progressing   Problem: Education: Goal: Knowledge of General Education information will improve Description: Including pain rating scale, medication(s)/side effects and non-pharmacologic comfort measures Outcome: Progressing   Problem: Health Behavior/Discharge Planning: Goal: Ability to manage health-related needs will improve Outcome: Progressing   Problem: Clinical Measurements: Goal: Ability to maintain clinical measurements within normal limits will improve Outcome: Progressing Goal: Will remain free from infection Outcome: Progressing

## 2019-11-10 NOTE — Progress Notes (Signed)
Physical Therapy Treatment Patient Details Name: Kristy Richardson MRN: [id number] DOB: 1950-12-06 Today's Date: 11/10/2019    History of Present Illness Pt is a 69 y.o. female s/p L TKA 8/17.  PMH includes anemia, L foot fx, LBP, h/o foot surgery, htn, stomach surgery 2012.    PT Comments    Pt was sitting in recliner with supportive sposue at bedside upon arriving. She agrees to PT session and is cooperative and pleasant throughout. Does endorse slightly more pain today however did not limit pt's participation. She was able to stand to RW with CGA only. Performed ascending/descedning 3 stair without rails using RW with CGA-min assist. Ambulated 100 ft afterwards prior to requesting seated rest. Overall pt is doing well and is cleared from PT standpoint for safe DC home with HHPT to follow. Will benefit from HHPT to address ROM, strength, and safe functional mobility deficits.    Follow Up Recommendations  Home health PT;Supervision for mobility/OOB     Equipment Recommendations  Other (comment) (pt has recieved all equipment)    Recommendations for Other Services       Precautions / Restrictions Precautions Precautions: Fall;Knee Precaution Booklet Issued: Yes (comment) Precaution Comments: wound vac Restrictions Weight Bearing Restrictions: Yes LLE Weight Bearing: Weight bearing as tolerated    Mobility  Bed Mobility               General bed mobility comments: pt was in recliner pre/post session  Transfers Overall transfer level: Needs assistance Equipment used: Rolling walker (2 wheeled) Transfers: Sit to/from Stand Sit to Stand: Min guard         General transfer comment: Pt was able to STS from recliner 4 x throughout session with CGA only for safety.  Ambulation/Gait Ambulation/Gait assistance: Min guard Gait Distance (Feet): 100 Feet Assistive device: Rolling walker (2 wheeled) Gait Pattern/deviations: Step-to pattern;Antalgic;Trunk flexed Gait  velocity: decreased   General Gait Details: no LOB or unsteadiness with ambulation. CGA for safety only   Stairs Stairs: Yes Stairs assistance: Min guard;Min assist Stair Management: Backwards;No rails;With walker Number of Stairs: 3 General stair comments: pt was able to demonstrate safe ability to ascend/descend 3 stair without use of rails. pt and pt's spouse reports confidence in ability to perform safely at home.   Wheelchair Mobility    Modified Rankin (Stroke Patients Only)       Balance Overall balance assessment: Needs assistance Sitting-balance support: No upper extremity supported;Feet supported Sitting balance-Leahy Scale: Good Sitting balance - Comments: steady sitting reaching within BOS   Standing balance support: No upper extremity supported Standing balance-Leahy Scale: Good Standing balance comment: No LOB in standing but does require BUE support with dynamic/functioanl standing task                            Cognition Arousal/Alertness: Awake/alert Behavior During Therapy: WFL for tasks assessed/performed Overall Cognitive Status: Within Functional Limits for tasks assessed                                 General Comments: Pt was at A and O x 4       Exercises Other Exercises Other Exercises: reviewed handout and pt states understanding    General Comments        Pertinent Vitals/Pain Pain Assessment: 0-10 Pain Score: 4  Pain Location: L knee Pain Descriptors / Indicators: Tender;Sore Pain Intervention(s):  Limited activity within patient's tolerance;Monitored during session;Premedicated before session;Repositioned;Ice applied    Home Living   Living Arrangements: Spouse/significant other                  Prior Function            PT Goals (current goals can now be found in the care plan section) Acute Rehab PT Goals Patient Stated Goal: to go home; improve pain Progress towards PT goals: Progressing  toward goals    Frequency    BID      PT Plan Current plan remains appropriate    Co-evaluation              AM-PAC PT "6 Clicks" Mobility   Outcome Measure  Help needed turning from your back to your side while in a flat bed without using bedrails?: A Little Help needed moving from lying on your back to sitting on the side of a flat bed without using bedrails?: A Little Help needed moving to and from a bed to a chair (including a wheelchair)?: A Little Help needed standing up from a chair using your arms (e.g., wheelchair or bedside chair)?: A Little Help needed to walk in hospital room?: A Little Help needed climbing 3-5 steps with a railing? : A Little 6 Click Score: 18    End of Session Equipment Utilized During Treatment: Gait belt Activity Tolerance: Patient limited by pain Patient left: in chair;with call bell/phone within reach;with chair alarm set;with family/visitor present Nurse Communication: Mobility status;Precautions;Weight bearing status;Patient requests pain meds PT Visit Diagnosis: Other abnormalities of gait and mobility (R26.89);Muscle weakness (generalized) (M62.81);Difficulty in walking, not elsewhere classified (R26.2);Pain Pain - Right/Left: Left Pain - part of body: Knee     Time: 1030-1055 PT Time Calculation (min) (ACUTE ONLY): 25 min  Charges:  $Gait Training: 23-37 mins                     Jetta Lout PTA 11/10/19, 11:18 AM

## 2019-11-10 NOTE — Progress Notes (Signed)
Walked to bathroom, unable to have BM. Dulcolax suppository

## 2019-12-07 ENCOUNTER — Other Ambulatory Visit: Payer: Self-pay | Admitting: Orthopedic Surgery

## 2019-12-09 ENCOUNTER — Other Ambulatory Visit: Payer: Self-pay

## 2019-12-09 ENCOUNTER — Encounter
Admission: RE | Admit: 2019-12-09 | Discharge: 2019-12-09 | Disposition: A | Discharge status: Home / Self Care | Payer: Medicare PPO | Source: Ambulatory Visit | Attending: Orthopedic Surgery | Admitting: Orthopedic Surgery

## 2019-12-09 NOTE — Patient Instructions (Signed)
Your procedure is scheduled on: Tuesday December 13, 2019. Report to Day Surgery inside [facility] 2nd floor. To find out your arrival time please call [phone] between 1PM - 3PM on Monday December 12, 2019.  Remember: Instructions that are not followed completely may result in serious medical risk,  up to and including death, or upon the discretion of your surgeon and anesthesiologist your  surgery may need to be rescheduled.     _X__ 1. Do not eat food after midnight the night before your procedure.                 No chewing gum or hard candies. You may drink clear liquids up to 2 hours                 before you are scheduled to arrive for your surgery- DO not drink clear                 liquids within 2 hours of the start of your surgery.                 Clear Liquids include:  water, apple juice without pulp, clear Gatorade, G2 or                  Gatorade Zero (avoid Red/Purple/Blue), Black Coffee or Tea (Do not add                 anything to coffee or tea).  __x__2.   Complete the "Ensure Clear Pre-surgery Clear Carbohydrate Drink" provided to you, 2 hours before arrival.   __X__3.  On the morning of surgery brush your teeth with toothpaste and water, you                may rinse your mouth with mouthwash if you wish.  Do not swallow any toothpaste of mouthwash.     _X__ 4.  No Alcohol for 24 hours before or after surgery.   _X__ 5.  Do Not Smoke or use e-cigarettes For 24 Hours Prior to Your Surgery.                 Do not use any chewable tobacco products for at least 6 hours prior to                 Surgery.  _X__  6.  Do not use any recreational drugs (marijuana, cocaine, heroin, ecstasy, MDMA or other)                For at least one week prior to your surgery.  Combination of these drugs with anesthesia                May have life threatening results.  __X__  7.  Notify your doctor if there is any change in your medical condition       (cold, fever, infections).     Do not wear jewelry, make-up, hairpins, clips or nail polish. Do not wear lotions, powders, or perfumes. You may wear deodorant. Do not shave 48 hours prior to surgery. Men may shave face and neck. Do not bring valuables to the hospital.    [facility] is not responsible for any belongings or valuables.  Contacts, dentures or bridgework may not be worn into surgery. Leave your suitcase in the car. After surgery it may be brought to your room. For patients admitted to the hospital, discharge time is determined by your treatment team.  Patients discharged the day of surgery will not be allowed to drive home.   Make arrangements for someone to be with you for the first 24 hours of your Same Day Discharge.   __X__ Take these medicines the morning of surgery with A SIP OF WATER:    1. amLODipine (NORVASC) 10 MG  2. cetirizine (ZYRTEC) 10 MG   3. acetaminophen (TYLENOL) 500 MG    __x__ Use CHG Soap as directed  __x__ Stop Anti-inflammatories such as Ibuprofen, Aleve, Advil, naproxen and or BC powders.    __x__ Stop supplements until after surgery.    __x__ Do not start any herbal supplements before your surgery.    If you have any questions regarding your pre-procedure instructions,  Please call Pre-admit Testing at [phone].

## 2019-12-12 ENCOUNTER — Other Ambulatory Visit
Admission: RE | Admit: 2019-12-12 | Discharge: 2019-12-12 | Disposition: A | Discharge status: Home / Self Care | Payer: Medicare PPO | Source: Ambulatory Visit | Attending: Orthopedic Surgery | Admitting: Orthopedic Surgery

## 2019-12-12 ENCOUNTER — Other Ambulatory Visit: Payer: Self-pay

## 2019-12-12 DIAGNOSIS — Z01812 Encounter for preprocedural laboratory examination: Secondary | ICD-10-CM | POA: Diagnosis present

## 2019-12-12 DIAGNOSIS — Z20822 Contact with and (suspected) exposure to covid-19: Secondary | ICD-10-CM | POA: Diagnosis not present

## 2019-12-13 ENCOUNTER — Ambulatory Visit: Payer: Medicare PPO | Admitting: Urgent Care

## 2019-12-13 ENCOUNTER — Other Ambulatory Visit: Payer: Self-pay

## 2019-12-13 ENCOUNTER — Encounter
Admission: RE | Disposition: A | Discharge status: Home / Self Care | Payer: Self-pay | Source: Home / Self Care | Attending: Orthopedic Surgery

## 2019-12-13 ENCOUNTER — Ambulatory Visit: Payer: Medicare PPO | Admitting: Anesthesiology

## 2019-12-13 ENCOUNTER — Ambulatory Visit
Admission: RE | Admit: 2019-12-13 | Discharge: 2019-12-13 | Disposition: A | Discharge status: Home / Self Care | Payer: Medicare PPO | Attending: Orthopedic Surgery | Admitting: Orthopedic Surgery

## 2019-12-13 ENCOUNTER — Encounter: Payer: Self-pay | Admitting: Orthopedic Surgery

## 2019-12-13 DIAGNOSIS — E669 Obesity, unspecified: Secondary | ICD-10-CM | POA: Insufficient documentation

## 2019-12-13 DIAGNOSIS — M24662 Ankylosis, left knee: Secondary | ICD-10-CM | POA: Diagnosis not present

## 2019-12-13 DIAGNOSIS — I10 Essential (primary) hypertension: Secondary | ICD-10-CM | POA: Insufficient documentation

## 2019-12-13 DIAGNOSIS — Z6833 Body mass index (BMI) 33.0-33.9, adult: Secondary | ICD-10-CM | POA: Insufficient documentation

## 2019-12-13 DIAGNOSIS — Z79899 Other long term (current) drug therapy: Secondary | ICD-10-CM | POA: Diagnosis not present

## 2019-12-13 DIAGNOSIS — Z96652 Presence of left artificial knee joint: Secondary | ICD-10-CM | POA: Diagnosis not present

## 2019-12-13 HISTORY — PX: KNEE CLOSED REDUCTION: SHX995

## 2019-12-13 LAB — SARS CORONAVIRUS 2 (TAT 6-24 HRS): SARS Coronavirus 2: NEGATIVE

## 2019-12-13 SURGERY — MANIPULATION, KNEE, CLOSED
Anesthesia: General | Site: Knee | Laterality: Left

## 2019-12-13 MED ORDER — PROPOFOL 10 MG/ML IV BOLUS
INTRAVENOUS | Status: DC | PRN
  Administered 2019-12-13: 70 mg via INTRAVENOUS

## 2019-12-13 MED ORDER — FENTANYL CITRATE (PF) 100 MCG/2ML IJ SOLN
25.0000 ug | INTRAMUSCULAR | Status: DC | PRN
  Administered 2019-12-13 (×4): 50 ug via INTRAVENOUS

## 2019-12-13 MED ORDER — CHLORHEXIDINE GLUCONATE 0.12 % MT SOLN
OROMUCOSAL | Status: AC
  Administered 2019-12-13: 15 mL via OROMUCOSAL
  Filled 2019-12-13: qty 15

## 2019-12-13 MED ORDER — PROPOFOL 10 MG/ML IV BOLUS
INTRAVENOUS | Status: AC
  Filled 2019-12-13: qty 20

## 2019-12-13 MED ORDER — ONDANSETRON HCL 4 MG/2ML IJ SOLN: 4.0000 mg | Freq: Four times a day (QID) | INTRAMUSCULAR | Status: DC | PRN

## 2019-12-13 MED ORDER — OXYCODONE HCL 5 MG PO TABS: 5.0000 mg | ORAL_TABLET | ORAL | 0 refills | Status: DC | PRN

## 2019-12-13 MED ORDER — LACTATED RINGERS IV SOLN: INTRAVENOUS | Status: DC

## 2019-12-13 MED ORDER — ONDANSETRON HCL 4 MG PO TABS: 4.0000 mg | ORAL_TABLET | Freq: Four times a day (QID) | ORAL | Status: DC | PRN

## 2019-12-13 MED ORDER — CHLORHEXIDINE GLUCONATE 0.12 % MT SOLN: 15.0000 mL | Freq: Once | OROMUCOSAL | Status: AC

## 2019-12-13 MED ORDER — FAMOTIDINE 20 MG PO TABS: 20.0000 mg | ORAL_TABLET | Freq: Once | ORAL | Status: AC

## 2019-12-13 MED ORDER — SODIUM CHLORIDE 0.9 % IV SOLN: INTRAVENOUS | Status: DC

## 2019-12-13 MED ORDER — FENTANYL CITRATE (PF) 100 MCG/2ML IJ SOLN
INTRAMUSCULAR | Status: AC
  Filled 2019-12-13: qty 2

## 2019-12-13 MED ORDER — METOCLOPRAMIDE HCL 5 MG/ML IJ SOLN: 5.0000 mg | Freq: Three times a day (TID) | INTRAMUSCULAR | Status: DC | PRN

## 2019-12-13 MED ORDER — OXYCODONE HCL 5 MG PO TABS
ORAL_TABLET | ORAL | Status: DC
Start: 2019-12-13 — End: 2019-12-13
  Filled 2019-12-13: qty 1

## 2019-12-13 MED ORDER — ORAL CARE MOUTH RINSE: 15.0000 mL | Freq: Once | OROMUCOSAL | Status: AC

## 2019-12-13 MED ORDER — METOCLOPRAMIDE HCL 10 MG PO TABS: 5.0000 mg | ORAL_TABLET | Freq: Three times a day (TID) | ORAL | Status: DC | PRN

## 2019-12-13 MED ORDER — OXYCODONE HCL 5 MG/5ML PO SOLN: 5.0000 mg | Freq: Once | ORAL | Status: AC | PRN

## 2019-12-13 MED ORDER — FAMOTIDINE 20 MG PO TABS
ORAL_TABLET | ORAL | Status: AC
  Administered 2019-12-13: 20 mg via ORAL
  Filled 2019-12-13: qty 1

## 2019-12-13 MED ORDER — OXYCODONE HCL 5 MG PO TABS
5.0000 mg | ORAL_TABLET | Freq: Once | ORAL | Status: AC | PRN
  Administered 2019-12-13: 5 mg via ORAL

## 2019-12-13 SURGICAL SUPPLY — 1 items: KIT TURNOVER KIT A (KITS) ×2 IMPLANT

## 2019-12-13 NOTE — Anesthesia Postprocedure Evaluation (Signed)
Anesthesia Post Note  Patient: Kristy Richardson  Procedure(s) Performed: Left total knee manipulation (Left Knee)  Patient location during evaluation: PACU Anesthesia Type: General Level of consciousness: awake and alert Pain management: pain level controlled Vital Signs Assessment: post-procedure vital signs reviewed and stable Respiratory status: spontaneous breathing, nonlabored ventilation, respiratory function stable and patient connected to nasal cannula oxygen Cardiovascular status: blood pressure returned to baseline and stable Postop Assessment: no apparent nausea or vomiting Anesthetic complications: no   No complications documented.   Last Vitals:  Vitals:   12/13/19 1128 12/13/19 1140  BP: (!) 149/93 130/79  Pulse: 70 67  Resp: 18 16  Temp: 36.7 C   SpO2: 98% 97%    Last Pain:  Vitals:   12/13/19 1128  TempSrc: Temporal  PainSc: 4                  Cleda Mccreedy Namita Yearwood

## 2019-12-13 NOTE — Op Note (Signed)
12/13/2019  10:40 AM  PATIENT:  Kristy Richardson  69 y.o. 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  BLOOD ADMINISTERED:none  DRAINS: none   LOCAL MEDICATIONS USED:  NONE  SPECIMEN:  No Specimen  DISPOSITION OF SPECIMEN:  N/A  COUNTS:  NO Closed procedure no count required  TOURNIQUET:  * No tourniquets in log *  IMPLANTS: None  DICTATION: .Dragon Dictation patient was brought to the operating room and after adequate general anesthesia was obtained with IV sedation appropriate patient identification and timeout procedure were completed.  At the start of the case range of motion was approximately 5 to 70 degrees.  The knee was brought up into flexion with the hip flexed and audible popping of adhesions was noted with flexion to approximately 105 degrees.  Full extension was also obtained with passive extension.  Patient tolerate procedure well and sent to recovery in stable condition  PLAN OF CARE: Discharge to home after PACU  PATIENT DISPOSITION:  PACU - hemodynamically stable.

## 2019-12-13 NOTE — Anesthesia Preprocedure Evaluation (Addendum)
Anesthesia Evaluation  Patient identified by MRN, date of birth, ID band Patient awake    Reviewed: Allergy & Precautions, H&P , NPO status , Patient's Chart, lab work & pertinent test results, reviewed documented beta blocker date and time   Airway Mallampati: II  TM Distance: <3 FB Neck ROM: limited    Dental  (+) Chipped   Pulmonary neg pulmonary ROS, neg shortness of breath,    Pulmonary exam normal        Cardiovascular Exercise Tolerance: Good hypertension, On Medications Normal cardiovascular exam Rhythm:regular Rate:Normal     Neuro/Psych negative neurological ROS  negative psych ROS   GI/Hepatic negative GI ROS, Neg liver ROS,   Endo/Other  negative endocrine ROS  Renal/GU negative Renal ROS  negative genitourinary   Musculoskeletal  (+) Arthritis ,   Abdominal   Peds  Hematology  (+) Blood dyscrasia, anemia ,   Anesthesia Other Findings Past Medical History: No date: Anemia No date: Arthritis     Comment:  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 No date: Dysfunctional uterine bleeding No date: Foot fracture, left No date: H/O cesarean section     Comment:  x 2 No date: Hyperlipidemia No date: Hypertension Past Surgical History: No date: CESAREAN SECTION     Comment:  x2 2012: STOMACH SURGERY BMI    Body Mass Index: 34.41 kg/m     Reproductive/Obstetrics negative OB ROS                            Anesthesia Physical  Anesthesia Plan  ASA: II  Anesthesia Plan: General   Post-op Pain Management:    Induction: Intravenous  PONV Risk Score and Plan: Propofol infusion and TIVA  Airway Management Planned: Natural Airway and Nasal Cannula  Additional Equipment:   Intra-op Plan:   Post-operative Plan:   Informed Consent: I have reviewed the patients History and Physical, chart, labs and discussed the procedure including the risks, benefits and  alternatives for the proposed anesthesia with the patient or authorized representative who has indicated his/her understanding and acceptance.     Dental Advisory Given  Plan Discussed with: Anesthesiologist, CRNA and Surgeon  Anesthesia Plan Comments: (Patient consented for risks of anesthesia including but not limited to:  - adverse reactions to medications - risk of intubation if required - damage to eyes, teeth, lips or other oral mucosa - nerve damage due to positioning  - sore throat or hoarseness - Damage to heart, brain, nerves, lungs, other parts of body or loss of life  Patient voiced understanding.)       Anesthesia Quick Evaluation

## 2019-12-13 NOTE — Progress Notes (Signed)
On pacu arrival patient moaning in pain and tearful.

## 2019-12-13 NOTE — Transfer of Care (Signed)
Immediate Anesthesia Transfer of Care Note  Patient: Kristy Richardson  Procedure(s) Performed: Left total knee manipulation (Left Knee)  Patient Location: PACU  Anesthesia Type:General  Level of Consciousness: awake and alert   Airway & Oxygen Therapy: Patient Spontanous Breathing and Patient connected to nasal cannula oxygen  Post-op Assessment: Report given to RN and Post -op Vital signs reviewed and stable  Post vital signs: Reviewed and stable  Last Vitals:  Vitals Value Taken Time  BP 144/94 12/13/19 1046  Temp 36.4 C 12/13/19 1046  Pulse 82 12/13/19 1050  Resp 22 12/13/19 1050  SpO2 100 % 12/13/19 1050  Vitals shown include unvalidated device data.  Last Pain:  Vitals:   12/13/19 0942  TempSrc: Oral  PainSc: 0-No pain         Complications: No complications documented.

## 2019-12-13 NOTE — Discharge Instructions (Addendum)
Work on range of motion is much as possible. Pain medicine is required. Polar Care to keep swelling down today and tomorrow is much as you can.  AMBULATORY SURGERY  DISCHARGE INSTRUCTIONS   1) The drugs that you were given will stay in your system until tomorrow so for the next 24 hours you should not:  A) Drive an automobile B) Make any legal decisions C) Drink any alcoholic beverage   2) You may resume regular meals tomorrow.  Today it is better to start with liquids and gradually work up to solid foods.  You may eat anything you prefer, but it is better to start with liquids, then soup and crackers, and gradually work up to solid foods.   3) Please notify your doctor immediately if you have any unusual bleeding, trouble breathing, redness and pain at the surgery site, drainage, fever, or pain not relieved by medication.    4) Additional Instructions:        Please contact your physician with any problems or Same Day Surgery at [phone], Monday through Friday 6 am to 4 pm, or [place] at [facility] number at [phone].

## 2019-12-13 NOTE — H&P (Signed)
Chief Complaint  Patient presents with  . Left Knee - Post Operative Visit   Kristy Richardson is a 69 y.o. female who presents today status post left total knee arthroplasty, date of surgery 11/08/2019 by Dr. Kennedy Bucker. Patient has been undergoing outpatient physical therapy and seeing very little progress with flexion. She has been able to progress to 72 to 75 degrees of flexion. Pain is improving. Swelling is improving. She is continuing with compression stockings and [facility] Care unit. No warmth erythema drainage. Patient states the knee feels very stiff and tight  Past Medical History: Past Medical History:  Diagnosis Date  . Anemia, unspecified  . Degenerative arthritis of left knee  . Degenerative arthritis of lumbar spine  . Dysfunctional uterine bleeding  . Essential hypertension, benign  . Foot fracture, left  . GERD (gastroesophageal reflux disease)  . Left knee pain  . Low back pain  . Obesity  . Osteoarthrosis, unspecified whether generalized or localized, lower leg  . Other and unspecified hyperlipidemia   Past Surgical History: Past Surgical History:  Procedure Laterality Date  . ARTHROPLASTY TOTAL KNEE Left 11/08/2019  Dr. Rosita Kea  . CESAREAN SECTION  times 2  . foot surgery  . Stomach surgery 2012  . wart removal Left  Left foot   Past Family History: Family History  Problem Relation Age of Onset  . Lung cancer Mother  . High blood pressure (Hypertension) Mother  . Sleep apnea Mother  . Parkinsonism Father  . Prostate cancer Father  . Myocardial Infarction (Heart attack) Other  . Arthritis Other  . Stroke Other   Medications: Current Outpatient Medications Ordered in Epic  Medication Sig Dispense Refill  . acetaminophen (TYLENOL) 500 MG tablet Take 500-1,000 mg by mouth once daily as needed for Pain  . amLODIPine (NORVASC) 10 MG tablet Take 1 tablet (10 mg total) by mouth once daily 90 tablet 1  . calcium carbonate-vitamin D3 (CALTRATE 600+D) 600  mg(1,500mg ) -400 unit tablet Take 1 tablet by mouth once daily.  . cetirizine (ZYRTEC) 10 MG tablet Take 1 tablet by mouth once daily as needed  . diclofenac (VOLTAREN) 1 % topical gel Apply 2 g topically 4 (four) times daily 150 g 11  . HYDROcodone-acetaminophen (NORCO) 5-325 mg tablet Take by mouth  . HYDROcodone-acetaminophen (NORCO) 5-325 mg tablet Take 1-2 tablets by mouth every 6 (six) hours as needed 30 tablet 0  . lisinopriL (ZESTRIL) 40 MG tablet Take 1 tablet (40 mg total) by mouth once daily 90 tablet 1  . potassium chloride (KLOR-CON M10) 10 mEq ER tablet Take 1 tablet (10 mEq total) by mouth once daily 90 tablet 1  . traMADoL (ULTRAM) 50 mg tablet Take 1 tablet (50 mg total) by mouth every 6 (six) hours as needed for Pain 40 tablet 1   No current Epic-ordered facility-administered medications on file.   Allergies: No Known Allergies   Review of Systems:  A comprehensive 14 point ROS was performed, reviewed by me today, and the pertinent orthopaedic findings are documented in the HPI.  Exam: BP 138/76  Ht 160 cm (5\' 3" )  Wt 86.2 kg (190 lb)  LMP (LMP Unknown)  BMI 33.66 kg/m   General:  Well developed, well nourished, no apparent distress, normal affect, presents in a wheelchair. Able to ambulate with a cane.  HEENT: Head normocephalic, atraumatic, PERRL.   Abdomen: Soft, non tender, non distended, Bowel sounds present.  Heart: Examination of the heart reveals regular, rate, and rhythm. There  is no murmur noted on ascultation. There is a normal apical pulse.  Lungs: Lungs are clear to auscultation. There is no wheeze, rhonchi, or crackles. There is normal expansion of bilateral chest walls.   Left Lower Extremity: 10 to 70 degrees range of motion of the left knee. Incision site is intact. No warmth erythema or drainage. Knee stable to valgus and varus stress testing. She is able to straight leg raise. Patella tracks well. Negative Homans' sign bilaterally.  AP  lateral sunrise views of the left knee are ordered interpreted by me in the office today. Impression: Patient is underwent left total knee arthroplasty. Tibial and femoral components are intact with no evidence of loosening or subsidence patella tracks well in the trochlear groove. Good cement mantle. No evidence of acute bony abnormality.  Impression: Status post total left knee replacement [Z96.652] Status post total left knee replacement (primary encounter diagnosis) Arthrofibrosis of knee joint, left  Plan:  54. 69 year old female status post left total knee arthroplasty on 11/08/2019. She has been undergoing home health and outpatient physical therapy and is struggling with flexion. Only been able to reach 70 to 75 degrees. Preoperative range of motion was 10 to 100 degrees.. Risks, benefits, complications of a left total knee manipulation with Dr. Rosita Kea have been discussed with the patient. Patient has agreed and consented to procedure with Dr. Kennedy Bucker. This note was generated in part with voice recognition software and I apologize for any typographical errors that were not detected and corrected.  [name] MPA-C    Electronically signed by [name], PA at 12/06/2019 5:03 PM EDT  Back to top of Progress Notes Reviewed paper H+P. No changes noted.

## 2019-12-13 NOTE — Progress Notes (Signed)
Elevated left extremity on 2 pillows and ice applied.  Neuro intact, color wnl and pink and warm to touch.

## 2019-12-21 ENCOUNTER — Other Ambulatory Visit: Payer: Self-pay | Admitting: Infectious Diseases

## 2019-12-21 DIAGNOSIS — Z1231 Encounter for screening mammogram for malignant neoplasm of breast: Secondary | ICD-10-CM

## 2020-01-14 LAB — COLOGUARD: COLOGUARD: NEGATIVE

## 2020-02-20 ENCOUNTER — Encounter: Payer: Self-pay | Admitting: Orthopedic Surgery

## 2020-02-21 ENCOUNTER — Other Ambulatory Visit: Payer: Self-pay | Admitting: Orthopedic Surgery

## 2020-02-29 ENCOUNTER — Other Ambulatory Visit: Payer: Self-pay

## 2020-02-29 ENCOUNTER — Other Ambulatory Visit
Admission: RE | Admit: 2020-02-29 | Discharge: 2020-02-29 | Disposition: A | Discharge status: Home / Self Care | Payer: Medicare PPO | Source: Ambulatory Visit | Attending: Orthopedic Surgery | Admitting: Orthopedic Surgery

## 2020-02-29 NOTE — Patient Instructions (Signed)
Your procedure is scheduled on: Tuesday March 06, 2020. Report to Day Surgery inside [facility] 2nd floor (stop by registration desk first). To find out your arrival time please call [phone] between 1PM - 3PM on Monday March 05, 2020.  Remember: Instructions that are not followed completely may result in serious medical risk,  up to and including death, or upon the discretion of your surgeon and anesthesiologist your  surgery may need to be rescheduled.     _X__ 1. Do not eat food after midnight the night before your procedure.                 No chewing gum or hard candies. You may drink clear liquids up to 2 hours                 before you are scheduled to arrive for your surgery- DO not drink clear                 liquids within 2 hours of the start of your surgery.                 Clear Liquids include:  water, apple juice without pulp, clear Gatorade, G2 or                  Gatorade Zero (avoid Red/Purple/Blue), Black Coffee or Tea (Do not add                 anything to coffee or tea).  __X__2.   Complete the "Ensure Clear Pre-surgery Clear Carbohydrate Drink" provided to you, 2 hours before arrival. **If you       are diabetic you will be provided with an alternative drink, Gatorade Zero or G2.  __X__3.  On the morning of surgery brush your teeth with toothpaste and water, you                may rinse your mouth with mouthwash if you wish.  Do not swallow any toothpaste of mouthwash.     _X__ 4.  No Alcohol for 24 hours before or after surgery.   _X__ 5.  Do Not Smoke or use e-cigarettes For 24 Hours Prior to Your Surgery.                 Do not use any chewable tobacco products for at least 6 hours prior to                 Surgery.  _X__  6.  Do not use any recreational drugs (marijuana, cocaine, heroin, ecstasy, MDMA or other)                For at least one week prior to your surgery.  Combination of these drugs with anesthesia                 May have life threatening results.  _X___  7.  Notify your doctor if there is any change in your medical condition      (cold, fever, infections).     Do not wear jewelry, make-up, hairpins, clips or nail polish. Do not wear lotions, powders, or perfumes. You may wear deodorant. Do not shave 48 hours prior to surgery. Men may shave face and neck. Do not bring valuables to the hospital.    [facility] is not responsible for any belongings or valuables.  Contacts, dentures or bridgework may not be worn into surgery. Leave your suitcase in the  car. After surgery it may be brought to your room. For patients admitted to the hospital, discharge time is determined by your treatment team.   Patients discharged the day of surgery will not be allowed to drive home.   Make arrangements for someone to be with you for the first 24 hours of your Same Day Discharge.  __x__ Take these medicines the morning of surgery with A SIP OF WATER:    1. amLODipine (NORVASC) 10 MG  2. cetirizine (ZYRTEC) 10 MG    ____ Fleet Enema (as directed)   __x__ Use CHG Soap (or wipes) as directed  ____ Use Benzoyl Peroxide Gel as instructed  ____ Use inhalers on the day of surgery  ____ Stop metformin 2 days prior to surgery    ____ Take 1/2 of usual insulin dose the night before surgery. No insulin the morning          of surgery.   ____ Stop Coumadin/Plavix/aspirin   __x__ Stop Anti-inflammatories such as Ibuprofen, Aleve, Advil, naproxen, aspirin and or BC powders.    __x__ Stop supplements until after surgery.    __x__ Do not start any herbal supplements before your procedure.    If you have any questions regarding your pre-procedure instructions,  Please call Pre-admit Testing at [phone].

## 2020-03-02 ENCOUNTER — Other Ambulatory Visit: Payer: Self-pay

## 2020-03-02 ENCOUNTER — Other Ambulatory Visit
Admission: RE | Admit: 2020-03-02 | Discharge: 2020-03-02 | Disposition: A | Discharge status: Home / Self Care | Payer: Medicare PPO | Source: Ambulatory Visit | Attending: Orthopedic Surgery | Admitting: Orthopedic Surgery

## 2020-03-02 DIAGNOSIS — Z20822 Contact with and (suspected) exposure to covid-19: Secondary | ICD-10-CM | POA: Diagnosis not present

## 2020-03-02 DIAGNOSIS — Z01812 Encounter for preprocedural laboratory examination: Secondary | ICD-10-CM | POA: Insufficient documentation

## 2020-03-03 LAB — SARS CORONAVIRUS 2 (TAT 6-24 HRS): SARS Coronavirus 2: NEGATIVE

## 2020-03-06 ENCOUNTER — Other Ambulatory Visit: Payer: Self-pay

## 2020-03-06 ENCOUNTER — Ambulatory Visit
Admission: RE | Admit: 2020-03-06 | Discharge: 2020-03-06 | Disposition: A | Discharge status: Home / Self Care | Payer: Medicare PPO | Attending: Orthopedic Surgery | Admitting: Orthopedic Surgery

## 2020-03-06 ENCOUNTER — Encounter: Payer: Self-pay | Admitting: Orthopedic Surgery

## 2020-03-06 ENCOUNTER — Ambulatory Visit: Payer: Medicare PPO | Admitting: Certified Registered Nurse Anesthetist

## 2020-03-06 ENCOUNTER — Encounter
Admission: RE | Disposition: A | Discharge status: Home / Self Care | Payer: Self-pay | Source: Home / Self Care | Attending: Orthopedic Surgery

## 2020-03-06 DIAGNOSIS — M1712 Unilateral primary osteoarthritis, left knee: Secondary | ICD-10-CM | POA: Insufficient documentation

## 2020-03-06 DIAGNOSIS — Z96652 Presence of left artificial knee joint: Secondary | ICD-10-CM | POA: Insufficient documentation

## 2020-03-06 DIAGNOSIS — Z79899 Other long term (current) drug therapy: Secondary | ICD-10-CM | POA: Insufficient documentation

## 2020-03-06 DIAGNOSIS — M24662 Ankylosis, left knee: Secondary | ICD-10-CM | POA: Insufficient documentation

## 2020-03-06 HISTORY — PX: KNEE ARTHROSCOPY: SHX127

## 2020-03-06 SURGERY — ARTHROSCOPY, KNEE
Anesthesia: General | Site: Knee | Laterality: Left

## 2020-03-06 MED ORDER — OXYCODONE HCL 5 MG PO TABS
ORAL_TABLET | ORAL | Status: AC
  Filled 2020-03-06: qty 1

## 2020-03-06 MED ORDER — BUPIVACAINE-EPINEPHRINE (PF) 0.5% -1:200000 IJ SOLN
INTRAMUSCULAR | Status: AC
  Filled 2020-03-06: qty 30

## 2020-03-06 MED ORDER — LIDOCAINE HCL (CARDIAC) PF 100 MG/5ML IV SOSY
PREFILLED_SYRINGE | INTRAVENOUS | Status: DC | PRN
  Administered 2020-03-06: 80 mg via INTRAVENOUS

## 2020-03-06 MED ORDER — ACETAMINOPHEN 10 MG/ML IV SOLN
INTRAVENOUS | Status: DC | PRN
  Administered 2020-03-06: 1000 mg via INTRAVENOUS

## 2020-03-06 MED ORDER — CEFAZOLIN SODIUM-DEXTROSE 2-4 GM/100ML-% IV SOLN
2.0000 g | INTRAVENOUS | Status: AC
  Administered 2020-03-06: 2 g via INTRAVENOUS

## 2020-03-06 MED ORDER — FENTANYL CITRATE (PF) 100 MCG/2ML IJ SOLN
INTRAMUSCULAR | Status: AC
  Filled 2020-03-06: qty 2

## 2020-03-06 MED ORDER — ONDANSETRON HCL 4 MG/2ML IJ SOLN: 4.0000 mg | Freq: Once | INTRAMUSCULAR | Status: DC | PRN

## 2020-03-06 MED ORDER — ORAL CARE MOUTH RINSE: 15.0000 mL | Freq: Once | OROMUCOSAL | Status: AC

## 2020-03-06 MED ORDER — FAMOTIDINE 20 MG PO TABS: 20.0000 mg | ORAL_TABLET | Freq: Once | ORAL | Status: AC

## 2020-03-06 MED ORDER — HYDROCODONE-ACETAMINOPHEN 10-325 MG PO TABS: 1.0000 | ORAL_TABLET | ORAL | 0 refills | Status: DC | PRN

## 2020-03-06 MED ORDER — ACETAMINOPHEN 10 MG/ML IV SOLN
INTRAVENOUS | Status: AC
  Filled 2020-03-06: qty 100

## 2020-03-06 MED ORDER — LACTATED RINGERS IV SOLN: INTRAVENOUS | Status: DC

## 2020-03-06 MED ORDER — LIDOCAINE HCL (PF) 2 % IJ SOLN
INTRAMUSCULAR | Status: AC
  Filled 2020-03-06: qty 5

## 2020-03-06 MED ORDER — MIDAZOLAM HCL 2 MG/2ML IJ SOLN
INTRAMUSCULAR | Status: DC | PRN
  Administered 2020-03-06 (×2): 1 mg via INTRAVENOUS

## 2020-03-06 MED ORDER — ONDANSETRON HCL 4 MG/2ML IJ SOLN
INTRAMUSCULAR | Status: AC
  Filled 2020-03-06: qty 2

## 2020-03-06 MED ORDER — OXYCODONE HCL 5 MG PO TABS
5.0000 mg | ORAL_TABLET | ORAL | Status: DC | PRN
  Administered 2020-03-06: 5 mg via ORAL

## 2020-03-06 MED ORDER — FENTANYL CITRATE (PF) 100 MCG/2ML IJ SOLN
INTRAMUSCULAR | Status: DC | PRN
  Administered 2020-03-06: 25 ug via INTRAVENOUS
  Administered 2020-03-06: 50 ug via INTRAVENOUS
  Administered 2020-03-06 (×5): 25 ug via INTRAVENOUS

## 2020-03-06 MED ORDER — CEFAZOLIN SODIUM-DEXTROSE 2-4 GM/100ML-% IV SOLN
INTRAVENOUS | Status: AC
  Filled 2020-03-06: qty 100

## 2020-03-06 MED ORDER — PROPOFOL 10 MG/ML IV BOLUS
INTRAVENOUS | Status: AC
  Filled 2020-03-06: qty 20

## 2020-03-06 MED ORDER — DEXAMETHASONE SODIUM PHOSPHATE 10 MG/ML IJ SOLN
INTRAMUSCULAR | Status: DC | PRN
  Administered 2020-03-06: 10 mg via INTRAVENOUS

## 2020-03-06 MED ORDER — MIDAZOLAM HCL 2 MG/2ML IJ SOLN
INTRAMUSCULAR | Status: AC
  Filled 2020-03-06: qty 2

## 2020-03-06 MED ORDER — CHLORHEXIDINE GLUCONATE 0.12 % MT SOLN
OROMUCOSAL | Status: AC
  Administered 2020-03-06: 15 mL via OROMUCOSAL
  Filled 2020-03-06: qty 15

## 2020-03-06 MED ORDER — CHLORHEXIDINE GLUCONATE 0.12 % MT SOLN: 15.0000 mL | Freq: Once | OROMUCOSAL | Status: AC

## 2020-03-06 MED ORDER — ONDANSETRON HCL 4 MG/2ML IJ SOLN
INTRAMUSCULAR | Status: DC | PRN
  Administered 2020-03-06: 4 mg via INTRAVENOUS

## 2020-03-06 MED ORDER — PROPOFOL 10 MG/ML IV BOLUS
INTRAVENOUS | Status: DC | PRN
  Administered 2020-03-06: 150 mg via INTRAVENOUS

## 2020-03-06 MED ORDER — FAMOTIDINE 20 MG PO TABS
ORAL_TABLET | ORAL | Status: AC
  Administered 2020-03-06: 20 mg via ORAL
  Filled 2020-03-06: qty 1

## 2020-03-06 MED ORDER — FENTANYL CITRATE (PF) 100 MCG/2ML IJ SOLN
25.0000 ug | INTRAMUSCULAR | Status: DC | PRN
Start: 2020-03-06 — End: 2020-03-06
  Administered 2020-03-06 (×4): 25 ug via INTRAVENOUS

## 2020-03-06 MED ORDER — DEXAMETHASONE SODIUM PHOSPHATE 10 MG/ML IJ SOLN
INTRAMUSCULAR | Status: AC
  Filled 2020-03-06: qty 1

## 2020-03-06 SURGICAL SUPPLY — 35 items
APL PRP STRL LF DISP 70% ISPRP (MISCELLANEOUS) ×1
BLADE INCISOR PLUS 4.5 (BLADE) IMPLANT
BLADE SHAVER 4.5X7 STR FR (MISCELLANEOUS) ×2 IMPLANT
BNDG ELASTIC 4X5.8 VLCR STR LF (GAUZE/BANDAGES/DRESSINGS) IMPLANT
CHLORAPREP W/TINT 26 (MISCELLANEOUS) ×2 IMPLANT
COVER WAND RF STERILE (DRAPES) ×2 IMPLANT
CUFF TOURN SGL QUICK 24 (TOURNIQUET CUFF)
CUFF TOURN SGL QUICK 30 (TOURNIQUET CUFF)
CUFF TRNQT CYL 24X4X16.5-23 (TOURNIQUET CUFF) IMPLANT
CUFF TRNQT CYL 30X4X21-28X (TOURNIQUET CUFF) IMPLANT
DRAPE C-ARMOR (DRAPES) IMPLANT
GAUZE SPONGE 4X4 12PLY STRL (GAUZE/BANDAGES/DRESSINGS) ×2 IMPLANT
GLOVE SURG SYN 9.0  PF PI (GLOVE) ×1
GLOVE SURG SYN 9.0 PF PI (GLOVE) ×1 IMPLANT
GOWN SRG 2XL LVL 4 RGLN SLV (GOWNS) ×1 IMPLANT
GOWN STRL NON-REIN 2XL LVL4 (GOWNS) ×2
GOWN STRL REUS W/ TWL LRG LVL3 (GOWN DISPOSABLE) ×2 IMPLANT
GOWN STRL REUS W/TWL LRG LVL3 (GOWN DISPOSABLE) ×4
IV LACTATED RINGER IRRG 3000ML (IV SOLUTION) ×4
IV LR IRRIG 3000ML ARTHROMATIC (IV SOLUTION) ×2 IMPLANT
KIT TURNOVER KIT A (KITS) ×2 IMPLANT
MANIFOLD NEPTUNE II (INSTRUMENTS) ×4 IMPLANT
NEEDLE HYPO 22GX1.5 SAFETY (NEEDLE) ×2 IMPLANT
PACK ARTHROSCOPY KNEE (MISCELLANEOUS) ×2 IMPLANT
PAD ABD DERMACEA PRESS 5X9 (GAUZE/BANDAGES/DRESSINGS) ×2 IMPLANT
SCALPEL PROTECTED #11 DISP (BLADE) ×2 IMPLANT
SET TUBE SUCT SHAVER OUTFL 24K (TUBING) ×2 IMPLANT
SET TUBE TIP INTRA-ARTICULAR (MISCELLANEOUS) ×2 IMPLANT
SUT ETHILON 4-0 (SUTURE) ×2
SUT ETHILON 4-0 FS2 18XMFL BLK (SUTURE) ×1
SUT VIC AB 2-0 SH 27 (SUTURE) ×2
SUT VIC AB 2-0 SH 27XBRD (SUTURE) ×1 IMPLANT
SUTURE ETHLN 4-0 FS2 18XMF BLK (SUTURE) ×1 IMPLANT
TUBING ARTHRO INFLOW-ONLY STRL (TUBING) ×2 IMPLANT
WAND COBLATION FLOW 50 (SURGICAL WAND) ×2 IMPLANT

## 2020-03-06 NOTE — Transfer of Care (Signed)
Immediate Anesthesia Transfer of Care Note  Patient: Kristy Richardson  Procedure(s) Performed: Left knee arthroscopic synovectomy (Left Knee)  Patient Location: PACU  Anesthesia Type:General  Level of Consciousness: awake  Airway & Oxygen Therapy: Patient connected to face mask oxygen  Post-op Assessment: Post -op Vital signs reviewed and stable  Post vital signs: Reviewed and stable  Last Vitals:  Vitals Value Taken Time  BP    Temp    Pulse 79 03/06/20 1246  Resp 17 03/06/20 1246  SpO2 95 % 03/06/20 1246  Vitals shown include unvalidated device data.  Last Pain:  Vitals:   03/06/20 0927  TempSrc: Oral  PainSc: 0-No pain         Complications: No complications documented.

## 2020-03-06 NOTE — Anesthesia Procedure Notes (Signed)
Procedure Name: LMA Insertion Date/Time: 03/06/2020 11:32 AM Performed by: Dava Najjar, CRNA Pre-anesthesia Checklist: Patient identified, Emergency Drugs available, Suction available and Patient being monitored Patient Re-evaluated:Patient Re-evaluated prior to induction Oxygen Delivery Method: Circle system utilized Preoxygenation: Pre-oxygenation with 100% oxygen Induction Type: IV induction LMA: LMA inserted LMA Size: 4.0 Number of attempts: 1 Placement Confirmation: positive ETCO2 Tube secured with: Tape Dental Injury: Teeth and Oropharynx as per pre-operative assessment

## 2020-03-06 NOTE — Anesthesia Postprocedure Evaluation (Signed)
Anesthesia Post Note  Patient: Kristy Richardson  Procedure(s) Performed: Left knee arthroscopic synovectomy (Left Knee)  Patient location during evaluation: PACU Anesthesia Type: General Level of consciousness: awake and alert Pain management: pain level controlled Vital Signs Assessment: post-procedure vital signs reviewed and stable Respiratory status: spontaneous breathing and respiratory function stable Cardiovascular status: stable Anesthetic complications: no   No complications documented.   Last Vitals:  Vitals:   03/06/20 1320 03/06/20 1325  BP: (!) 141/86   Pulse: 69 74  Resp: (!) 21 15  Temp:    SpO2: 93% 98%    Last Pain:  Vitals:   03/06/20 1325  TempSrc:   PainSc: 5                  Graesyn Schreifels K

## 2020-03-06 NOTE — Anesthesia Preprocedure Evaluation (Addendum)
Anesthesia Evaluation  Patient identified by MRN, date of birth, ID band Patient awake    Reviewed: Allergy & Precautions, NPO status , Patient's Chart, lab work & pertinent test results  History of Anesthesia Complications Negative for: history of anesthetic complications  Airway Mallampati: II       Dental   Pulmonary neg sleep apnea, neg COPD, Not current smoker,           Cardiovascular hypertension, Pt. on medications (-) Past MI and (-) CHF (-) dysrhythmias (-) Valvular Problems/Murmurs     Neuro/Psych neg Seizures    GI/Hepatic Neg liver ROS, neg GERD  ,  Endo/Other  neg diabetes  Renal/GU negative Renal ROS     Musculoskeletal   Abdominal   Peds  Hematology  (+) anemia ,   Anesthesia Other Findings   Reproductive/Obstetrics                            Anesthesia Physical Anesthesia Plan  ASA: II  Anesthesia Plan: General   Post-op Pain Management:    Induction: Intravenous  PONV Risk Score and Plan: 3  Airway Management Planned: LMA  Additional Equipment:   Intra-op Plan:   Post-operative Plan:   Informed Consent: I have reviewed the patients History and Physical, chart, labs and discussed the procedure including the risks, benefits and alternatives for the proposed anesthesia with the patient or authorized representative who has indicated his/her understanding and acceptance.       Plan Discussed with:   Anesthesia Plan Comments:         Anesthesia Quick Evaluation

## 2020-03-06 NOTE — Discharge Instructions (Addendum)
Work as hard as you can back bending and straightening the knee. Pain medicine as directed Continue regular medications resume all of them today. Weightbearing as tolerated on right leg Keep dressing clean and dry. Call office if you are having problems.   AMBULATORY SURGERY  DISCHARGE INSTRUCTIONS   1) The drugs that you were given will stay in your system until tomorrow so for the next 24 hours you should not:  A) Drive an automobile B) Make any legal decisions C) Drink any alcoholic beverage   2) You may resume regular meals tomorrow.  Today it is better to start with liquids and gradually work up to solid foods.  You may eat anything you prefer, but it is better to start with liquids, then soup and crackers, and gradually work up to solid foods.   3) Please notify your doctor immediately if you have any unusual bleeding, trouble breathing, redness and pain at the surgery site, drainage, fever, or pain not relieved by medication.    4) Additional Instructions:        Please contact your physician with any problems or Same Day Surgery at [phone], Monday through Friday 6 am to 4 pm, or [place] number at [phone].

## 2020-03-06 NOTE — Op Note (Signed)
03/06/2020  2:07 PM  PATIENT:  Kristy Richardson  69 y.o. female  PRE-OPERATIVE DIAGNOSIS:  Arthrofibrosis of knee joint, left M24.662 Primary osteoarthritis of left knee M17.12  POST-OPERATIVE DIAGNOSIS:  Arthrofibrosis of knee joint, left M24.662  PROCEDURE:  Procedure(s): Left knee arthroscopic synovectomy (Left)  SURGEON: Leitha Schuller, MD  ASSISTANTS: None  ANESTHESIA:   general  EBL:  Total I/O In: 200 [IV Piggyback:200] Out: -   BLOOD ADMINISTERED:none  DRAINS: none   LOCAL MEDICATIONS USED:  NONE  SPECIMEN:  No Specimen  DISPOSITION OF SPECIMEN:  N/A  COUNTS:  YES  TOURNIQUET:   Total Tourniquet Time Documented: Thigh (Left) - 36 minutes Total: Thigh (Left) - 36 minutes   IMPLANTS: None  DICTATION: .Dragon Dictation patient was brought to the operating room and after adequate anesthesia was obtained the left leg was prepped and draped in usual sterile fashion with a tourniquet applied the upper thigh.  After prepping and draping the usual sterile manner appropriate patient identification and timeout procedures were completed.  A superior lateral portal was made and the trocar advanced into the joint and the suprapatellar pouch and lateral gutter were opened up following this and inferior medial portal was made medial gutter and infrapatellar pouch area created with the blunt trocar and then an inferomedial portal for the arthroscope which was introduced and showed extensive scarring in all areas of the knee.  There was thick scar tissue in the infrapatellar region as well as in the suprapatellar pouch completely and the locking it down using a combination of arthroscopic shaver and ArthroCare wand the pouches in the suprapatellar infrapatellar areas were recreated along the gutters so there was not soft tissue impinging on the joint.  There was soft tissue start of the case impinging at the trochlear groove and had benign appearance of just thick connective tissue.   Care was taken to preserve patellar tendon and quadriceps tendon.  After releasing the structures at the start of the case flexion was approximately 70 degrees at the close as 95 and there was a sensation of a bouncer additional give may be obtained over time augmentation was withdrawn and the wounds were closed with 2-0 Vicryl subcutaneously followed by simple erupted 4-0 nylon for the skin Xeroform 4 x 4's ABD web roll and Ace wrap applied  PLAN OF CARE: Discharge to home after PACU  PATIENT DISPOSITION:  PACU - hemodynamically stable.

## 2020-03-06 NOTE — H&P (Signed)
Chief Complaint  Patient presents with  . Follow-up  S/P Lt TKA    History of the Present Illness: Kristy Richardson is a 69 y.o. female here today for follow-up evaluation status post left total knee arthroplasty preformed on 11/08/2019, and a left knee manipulation on 12/13/2019, as she was experiencing difficulty with stiffness while attending physical therapy. The patient received a corticosteroid injection for trochanteric bursitis on 01/25/2020, from [facility], DO, due to left hip pain. The therapy note from earlier today revealed flexion to 97 degrees, with Zenovia Jordan, LPTA. I consulted with her physical therapist and she has been plateaued at 97 degrees of flexion for the past 3 weeks.   The patient states she is not happy with her gait. She reports she continues to ambulate with a limp, which is partly related to limping prior to her surgery. The patient notes her bursa injection provided pain relief. She endorses taking tramadol, as she experiences some nighttime knee pain.   I have reviewed past medical, surgical, social and family history, and allergies as documented in the EMR.  Past Medical History: Past Medical History:  Diagnosis Date  . Anemia, unspecified  . Degenerative arthritis of left knee  . Degenerative arthritis of lumbar spine  . Dysfunctional uterine bleeding  . Essential hypertension, benign  . Foot fracture, left  . GERD (gastroesophageal reflux disease)  . Left knee pain  . Low back pain  . Obesity  . Osteoarthrosis, unspecified whether generalized or localized, lower leg  . Other and unspecified hyperlipidemia   Past Surgical History: Past Surgical History:  Procedure Laterality Date  . ARTHROPLASTY TOTAL KNEE Left 11/08/2019  Dr. Rosita Kea  . CESAREAN SECTION  times 2  . foot surgery  . JOINT REPLACEMENT Left 11/08/2019  Daniele Dillow  . MANIPULATION JOINT UNDER ANESTHESIA KNEE Left 12/13/2019  Dr. Rosita Kea  . Stomach surgery 2012  . wart removal  Left  Left foot   Past Family History: Family History  Problem Relation Age of Onset  . Lung cancer Mother  . High blood pressure (Hypertension) Mother  . Sleep apnea Mother  . Parkinsonism Father  . Prostate cancer Father  . Myocardial Infarction (Heart attack) Other  . Arthritis Other  . Stroke Other   Medications: Current Outpatient Medications Ordered in Epic  Medication Sig Dispense Refill  . acetaminophen (TYLENOL) 500 MG tablet Take 500-1,000 mg by mouth once daily as needed for Pain  . amLODIPine (NORVASC) 10 MG tablet Take 1 tablet (10 mg total) by mouth once daily 90 tablet 1  . calcium carbonate-vitamin D3 (CALTRATE 600+D) 600 mg(1,500mg ) -400 unit tablet Take 1 tablet by mouth once daily.  . cetirizine (ZYRTEC) 10 MG tablet Take 1 tablet by mouth once daily as needed  . diazePAM (VALIUM) 5 MG tablet Take 1 tablet 30 minutes prior to physical therapy 20 tablet 0  . HYDROcodone-acetaminophen (NORCO) 5-325 mg tablet Take 1-2 tablets by mouth every 6 (six) hours as needed 40 tablet 0  . KLOR-CON M10 10 mEq ER tablet TAKE 1 TABLET BY MOUTH ONCE DAILY 90 tablet 1  . lisinopriL (ZESTRIL) 40 MG tablet Take 1 tablet (40 mg total) by mouth once daily 90 tablet 1  . traMADoL (ULTRAM) 50 mg tablet Take 1 tablet (50 mg total) by mouth every 6 (six) hours as needed for Pain 40 tablet 1   No current Epic-ordered facility-administered medications on file.   Allergies: No Known Allergies   Body mass index is 33.05 kg/m.  Review of Systems: A comprehensive 14 point ROS was performed, reviewed, and the pertinent orthopaedic findings are documented in the HPI.  Vitals:  02/13/20 1333  BP: 156/84    General Physical Examination:  General/Constitutional: No apparent distress: well-nourished and well developed. Eyes: Pupils equal, round with synchronous movement. Lungs: Clear to auscultation HEENT: Normal Vascular: No edema, swelling or tenderness, except as noted in detailed  exam. Cardiac: Heart rate and rhythm is regular. Integumentary: No impressive skin lesions present, except as noted in detailed exam. Neuro/Psych: Normal mood and affect, oriented to person, place and time.  Musculoskeletal Examination: On exam, antalgic gait. No pain with left hip rotation.  Radiographs: No new imaging studies were obtained or reviewed today.  Assessment: ICD-10-CM  1. Arthrofibrosis of knee joint, left M24.662  2. Primary osteoarthritis of left knee M17.12  3. Status post total left knee replacement 11/08/2019 with manipulation on 12/13/2019 M60.045   Plan: The patient has clinical findings of arthrofibrosis status post left total knee arthroplasty.   We discussed the patient's prior x-ray findings. I explained her scar tissue has reformed, which is limiting her range of motion. I recommend left knee arthroscopy and synovectomy. I explained the surgery and postoperative course in detail. I advised her to hold off on physical therapy until the day after surgery. We provided at-home exercises and noted the patient may ambulate short distances.   Surgical Risks:  The nature of the condition and the proposed procedure has been reviewed in detail with the patient. Surgical versus non-surgical options and prognosis for recovery have been reviewed and the inherent risks and benefits of each have been discussed including the risks of infection, bleeding, injury to nerves/blood vessels/tendons, incomplete relief of symptoms, persisting pain and/or stiffness, loss of function, complex regional pain syndrome, failure of the procedure, as appropriate.   Attestation: Caren Griffins, am documenting for [facility], MD utilizing [employer] DAX.     Electronically signed by Marlena Clipper, MD at 02/15/2020 9:34 AM EST   Reviewed  H+P. No changes noted.

## 2020-07-17 ENCOUNTER — Ambulatory Visit
Admission: RE | Admit: 2020-07-17 | Discharge: 2020-07-17 | Disposition: A | Discharge status: Home / Self Care | Payer: Medicare PPO | Source: Ambulatory Visit | Attending: Infectious Diseases | Admitting: Infectious Diseases

## 2020-07-17 ENCOUNTER — Other Ambulatory Visit: Payer: Self-pay

## 2020-07-17 DIAGNOSIS — Z1231 Encounter for screening mammogram for malignant neoplasm of breast: Secondary | ICD-10-CM | POA: Diagnosis present

## 2021-07-18 IMAGING — DX DG KNEE 1-2V*L*
2 series · 2 of 2 positions shown · non-contrast
Comparison: CT 09/15/2019

CLINICAL DATA: Knee replaced

EXAM:
LEFT KNEE - 1-2 VIEW

[knee ap]
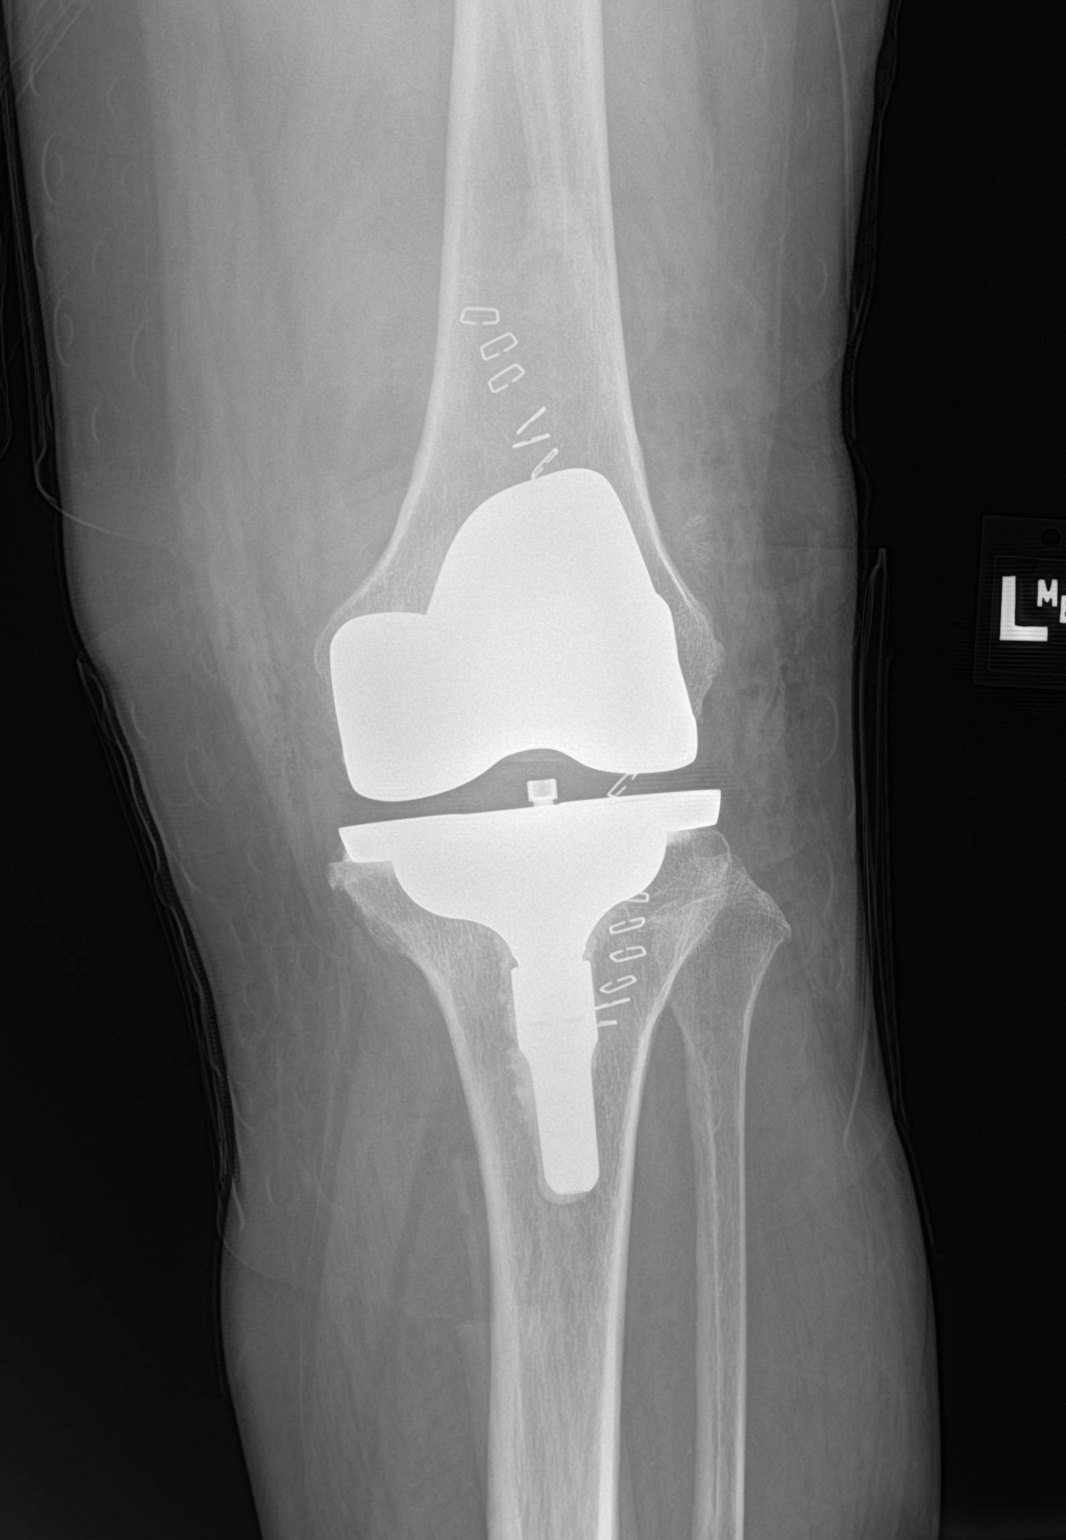

[knee lat]
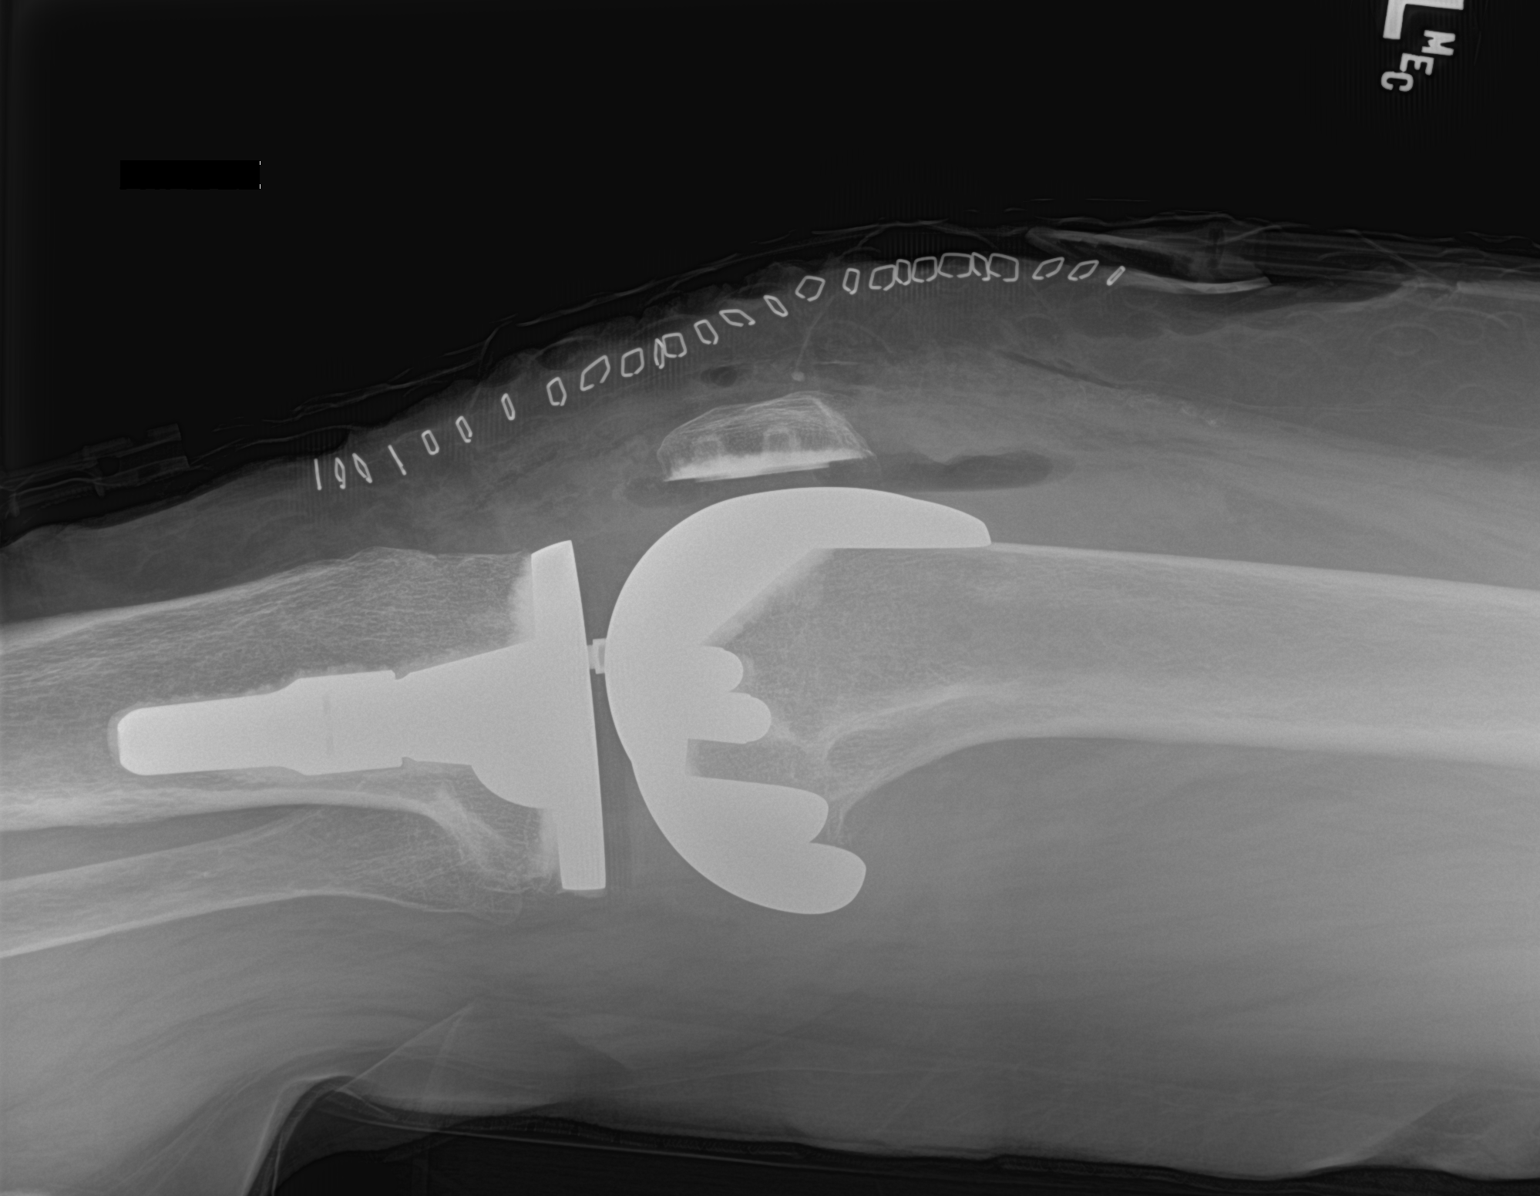

[2 of 2 positions shown; findings below may reference images not displayed]

FINDINGS: Total knee arthroplasty. No periprosthetic fracture or joint
subluxation. Soft tissue and joint gas, expected. Generalized
osteopenia
IMPRESSION: Total knee arthroplasty without complicating feature.

## 2021-08-07 ENCOUNTER — Other Ambulatory Visit: Payer: Self-pay | Admitting: Infectious Diseases

## 2021-08-07 DIAGNOSIS — Z1231 Encounter for screening mammogram for malignant neoplasm of breast: Secondary | ICD-10-CM

## 2021-11-07 ENCOUNTER — Ambulatory Visit
Admission: RE | Admit: 2021-11-07 | Discharge: 2021-11-07 | Disposition: A | Discharge status: Home / Self Care | Payer: Medicare PPO | Source: Ambulatory Visit | Attending: Infectious Diseases | Admitting: Infectious Diseases

## 2021-11-07 DIAGNOSIS — Z1231 Encounter for screening mammogram for malignant neoplasm of breast: Secondary | ICD-10-CM | POA: Insufficient documentation

## 2022-08-12 ENCOUNTER — Other Ambulatory Visit: Payer: Self-pay | Admitting: Infectious Diseases

## 2022-08-12 DIAGNOSIS — Z1231 Encounter for screening mammogram for malignant neoplasm of breast: Secondary | ICD-10-CM

## 2022-12-23 ENCOUNTER — Ambulatory Visit: Payer: Medicare PPO

## 2022-12-23 DIAGNOSIS — K640 First degree hemorrhoids: Secondary | ICD-10-CM

## 2022-12-23 DIAGNOSIS — K5730 Diverticulosis of large intestine without perforation or abscess without bleeding: Secondary | ICD-10-CM | POA: Diagnosis not present

## 2022-12-23 DIAGNOSIS — Z1211 Encounter for screening for malignant neoplasm of colon: Secondary | ICD-10-CM | POA: Diagnosis not present

## 2022-12-23 DIAGNOSIS — D123 Benign neoplasm of transverse colon: Secondary | ICD-10-CM | POA: Diagnosis not present

## 2022-12-23 DIAGNOSIS — D122 Benign neoplasm of ascending colon: Secondary | ICD-10-CM | POA: Diagnosis not present

## 2023-01-13 ENCOUNTER — Ambulatory Visit
Admission: RE | Admit: 2023-01-13 | Discharge: 2023-01-13 | Disposition: A | Discharge status: Home / Self Care | Payer: Medicare PPO | Source: Ambulatory Visit | Attending: Infectious Diseases | Admitting: Infectious Diseases

## 2023-01-13 DIAGNOSIS — Z1231 Encounter for screening mammogram for malignant neoplasm of breast: Secondary | ICD-10-CM | POA: Diagnosis present

## 2023-12-18 ENCOUNTER — Other Ambulatory Visit: Payer: Self-pay | Admitting: Orthopedic Surgery

## 2023-12-31 ENCOUNTER — Encounter
Admission: RE | Admit: 2023-12-31 | Discharge: 2023-12-31 | Disposition: A | Discharge status: Home / Self Care | Source: Ambulatory Visit | Attending: Orthopedic Surgery | Admitting: Orthopedic Surgery

## 2023-12-31 ENCOUNTER — Other Ambulatory Visit: Payer: Self-pay

## 2023-12-31 VITALS — BP 143/76 | HR 71 | Resp 12 | Ht 63.0 in | Wt 209.5 lb

## 2023-12-31 DIAGNOSIS — Z01812 Encounter for preprocedural laboratory examination: Secondary | ICD-10-CM | POA: Insufficient documentation

## 2023-12-31 DIAGNOSIS — Z01818 Encounter for other preprocedural examination: Secondary | ICD-10-CM

## 2023-12-31 HISTORY — DX: Unilateral primary osteoarthritis, right knee: M17.11

## 2023-12-31 HISTORY — DX: Other specified disorders of bone density and structure, unspecified site: M85.80

## 2023-12-31 HISTORY — DX: Cyst of kidney, acquired: N28.1

## 2023-12-31 HISTORY — DX: Bilateral primary osteoarthritis of knee: M17.0

## 2023-12-31 HISTORY — DX: Gastro-esophageal reflux disease without esophagitis: K21.9

## 2023-12-31 HISTORY — DX: Prediabetes: R73.03

## 2023-12-31 HISTORY — DX: Diaphragmatic hernia without obstruction or gangrene: K44.9

## 2023-12-31 HISTORY — DX: Other specified diseases of liver: K76.89

## 2023-12-31 HISTORY — DX: Spondylosis without myelopathy or radiculopathy, lumbar region: M47.816

## 2023-12-31 LAB — URINALYSIS, ROUTINE W REFLEX MICROSCOPIC
Bilirubin Urine: NEGATIVE
Glucose, UA: NEGATIVE mg/dL
Hgb urine dipstick: NEGATIVE
Ketones, ur: NEGATIVE mg/dL
Leukocytes,Ua: NEGATIVE
Nitrite: NEGATIVE
Protein, ur: NEGATIVE mg/dL
Specific Gravity, Urine: 1.008 (ref 1.005–1.030)
pH: 6 (ref 5.0–8.0)

## 2023-12-31 LAB — SURGICAL PCR SCREEN
MRSA, PCR: NEGATIVE
Staphylococcus aureus: NEGATIVE

## 2023-12-31 NOTE — Patient Instructions (Addendum)
 Your procedure is scheduled on:01-07-24 Thursday Report to the Registration Desk on the 1st floor of the [facility].Then proceed to the 2nd floor Surgery Desk To find out your arrival time, please call [phone] between 1PM - 3PM on:01-06-24 Wednesday If your arrival time is 6:00 am, do not arrive before that time as the [facility] entrance doors do not open until 6:00 am.  REMEMBER: Instructions that are not followed completely may result in serious medical risk, up to and including death; or upon the discretion of your surgeon and anesthesiologist your surgery may need to be rescheduled.  Do not eat food after midnight the night before surgery.  No gum chewing or hard candies.  You may however, drink CLEAR liquids up to 2 hours before you are scheduled to arrive for your surgery. Do not drink anything within 2 hours of your scheduled arrival time.  Clear liquids include: - water  - apple juice without pulp - gatorade (not RED colors) - black coffee or tea (Do NOT add milk or creamers to the coffee or tea) Do NOT drink anything that is not on this list.  In addition, your doctor has ordered for you to drink the provided:  Ensure Pre-Surgery Clear Carbohydrate Drink  Drinking this carbohydrate drink up to two hours before surgery helps to reduce insulin resistance and improve patient outcomes. Please complete drinking 2 hours before scheduled arrival time.  One week prior to surgery:Stop NOW (12-31-23) Stop Anti-inflammatories (NSAIDS) such as Advil, Aleve, Ibuprofen, Motrin, Naproxen, Naprosyn and Aspirin based products such as Excedrin, Goody's Powder, BC Powder. Stop ANY OVER THE COUNTER supplements until after surgery (Vitamin D )  You may however, continue to take Tylenol  if needed for pain up until the day of surgery  Continue taking all of your other prescription medications up until the day of surgery.  ON THE DAY OF SURGERY ONLY TAKE THESE MEDICATIONS WITH SIPS OF  WATER: -amLODipine  (NORVASC )  -potassium chloride  (KLOR-CON )  -cetirizine (ZYRTEC)   No Alcohol for 24 hours before or after surgery.  No Smoking including e-cigarettes for 24 hours before surgery.  No chewable tobacco products for at least 6 hours before surgery.  No nicotine patches on the day of surgery.  Do not use any recreational drugs for at least a week (preferably 2 weeks) before your surgery.  Please be advised that the combination of cocaine and anesthesia may have negative outcomes, up to and including death. If you test positive for cocaine, your surgery will be cancelled.  On the morning of surgery brush your teeth with toothpaste and water, you may rinse your mouth with mouthwash if you wish. Do not swallow any toothpaste or mouthwash.  Use CHG Soap as directed on instruction sheet.  Do not wear jewelry, make-up, hairpins, clips or nail polish.  For welded (permanent) jewelry: bracelets, anklets, waist bands, etc.  Please have this removed prior to surgery.  If it is not removed, there is a chance that hospital personnel will need to cut it off on the day of surgery.  Do not wear lotions, powders, or perfumes.   Do not shave body hair from the neck down 48 hours before surgery.  Contact lenses, hearing aids and dentures may not be worn into surgery.  Do not bring valuables to the hospital. [facility] is not responsible for any missing/lost belongings or valuables.   Notify your doctor if there is any change in your medical condition (cold, fever, infection).  Wear comfortable clothing (  specific to your surgery type) to the hospital.  After surgery, you can help prevent lung complications by doing breathing exercises.  Take deep breaths and cough every 1-2 hours. Your doctor may order a device called an Incentive Spirometer to help you take deep breaths. When coughing or sneezing, hold a pillow firmly against your incision with both hands. This is called  "splinting." Doing this helps protect your incision. It also decreases belly discomfort.  If you are being admitted to the hospital overnight, leave your suitcase in the car. After surgery it may be brought to your room.  In case of increased patient census, it may be necessary for you, the patient, to continue your postoperative care in the Same Day Surgery department.  If you are being discharged the day of surgery, you will not be allowed to drive home. You will need a responsible individual to drive you home and stay with you for 24 hours after surgery.   If you are taking public transportation, you will need to have a responsible individual with you.  Please call the Pre-admissions Testing Dept. at [phone] if you have any questions about these instructions.  Surgery Visitation Policy:  Patients having surgery or a procedure may have two visitors.  Children under the age of 84 must have an adult with them who is not the patient.  Inpatient Visitation:    Visiting hours are 7 a.m. to 8 p.m. Up to four visitors are allowed at one time in a patient room. The visitors may rotate out with other people during the day.  One visitor age 70 or older may stay with the patient overnight and must be in the room by 8 p.m.    Pre-operative 4 CHG Bath Instructions   You can play a key role in reducing the risk of infection after surgery. Your skin needs to be as free of germs as possible. You can reduce the number of germs on your skin by washing with CHG (chlorhexidine  gluconate) soap before surgery. CHG is an antiseptic soap that kills germs and continues to kill germs even after washing.   DO NOT use if you have an allergy to chlorhexidine /CHG or antibacterial soaps. If your skin becomes reddened or irritated, stop using the CHG and notify one of our RNs at [phone].   Please shower with the CHG soap starting 4 days before surgery using the following schedule:     Please keep  in mind the following:  DO NOT shave, including legs and underarms, starting the day of your first shower.   You may shave your face at any point before/day of surgery.  Place clean sheets on your bed the day you start using CHG soap. Use a clean washcloth (not used since being washed) for each shower. DO NOT sleep with pets once you start using the CHG.   CHG Shower Instructions:  If you choose to wash your hair and private area, wash first with your normal shampoo/soap.  After you use shampoo/soap, rinse your hair and body thoroughly to remove shampoo/soap residue.  Turn the water OFF and apply about 3 tablespoons (45 ml) of CHG soap to a CLEAN washcloth.  Apply CHG soap ONLY FROM YOUR NECK DOWN TO YOUR TOES (washing for 3-5 minutes)  DO NOT use CHG soap on face, private areas, open wounds, or sores.  Pay special attention to the area where your surgery is being performed.  If you are having back surgery, having someone wash your  back for you may be helpful. Wait 2 minutes after CHG soap is applied, then you may rinse off the CHG soap.  Pat dry with a clean towel  Put on clean clothes/pajamas   If you choose to wear lotion, please use ONLY the CHG-compatible lotions on the back of this paper.     Additional instructions for the day of surgery: DO NOT APPLY any lotions, deodorants, cologne, or perfumes.   Put on clean/comfortable clothes.  Brush your teeth.  Ask your nurse before applying any prescription medications to the skin.      CHG Compatible Lotions   Aveeno Moisturizing lotion  Cetaphil Moisturizing Cream  Cetaphil Moisturizing Lotion  Clairol Herbal Essence Moisturizing Lotion, Dry Skin  Clairol Herbal Essence Moisturizing Lotion, Extra Dry Skin  Clairol Herbal Essence Moisturizing Lotion, Normal Skin  Curel Age Defying Therapeutic Moisturizing Lotion with Alpha Hydroxy  Curel Extreme Care Body Lotion  Curel Soothing Hands Moisturizing Hand Lotion  Curel  Therapeutic Moisturizing Cream, Fragrance-Free  Curel Therapeutic Moisturizing Lotion, Fragrance-Free  Curel Therapeutic Moisturizing Lotion, Original Formula  Eucerin Daily Replenishing Lotion  Eucerin Dry Skin Therapy Plus Alpha Hydroxy Crme  Eucerin Dry Skin Therapy Plus Alpha Hydroxy Lotion  Eucerin Original Crme  Eucerin Original Lotion  Eucerin Plus Crme Eucerin Plus Lotion  Eucerin TriLipid Replenishing Lotion  Keri Anti-Bacterial Hand Lotion  Keri Deep Conditioning Original Lotion Dry Skin Formula Softly Scented  Keri Deep Conditioning Original Lotion, Fragrance Free Sensitive Skin Formula  Keri Lotion Fast Absorbing Fragrance Free Sensitive Skin Formula  Keri Lotion Fast Absorbing Softly Scented Dry Skin Formula  Keri Original Lotion  Keri Skin Renewal Lotion Keri Silky Smooth Lotion  Keri Silky Smooth Sensitive Skin Lotion  Nivea Body Creamy Conditioning Oil  Nivea Body Extra Enriched Lotion  Nivea Body Original Lotion  Nivea Body Sheer Moisturizing Lotion Nivea Crme  Nivea Skin Firming Lotion  NutraDerm 30 Skin Lotion  NutraDerm Skin Lotion  NutraDerm Therapeutic Skin Cream  NutraDerm Therapeutic Skin Lotion  ProShield Protective Hand Cream  Provon moisturizing lotion  How to Use an Incentive Spirometer An incentive spirometer is a tool that measures how well you are filling your lungs with each breath. Learning to take long, deep breaths using this tool can help you keep your lungs clear and active. This may help to reverse or lessen your chance of developing breathing (pulmonary) problems, especially infection. You may be asked to use a spirometer: After a surgery. If you have a lung problem or a history of smoking. After a long period of time when you have been unable to move or be active. If the spirometer includes an indicator to show the highest number that you have reached, your health care provider or respiratory therapist will help you set a goal. Keep a log  of your progress as told by your health care provider. What are the risks? Breathing too quickly may cause dizziness or cause you to pass out. Take your time so you do not get dizzy or light-headed. If you are in pain, you may need to take pain medicine before doing incentive spirometry. It is harder to take a deep breath if you are having pain. How to use your incentive spirometer  Sit up on the edge of your bed or on a chair. Hold the incentive spirometer so that it is in an upright position. Before you use the spirometer, breathe out normally. Place the mouthpiece in your mouth. Make sure your lips are closed tightly  around it. Breathe in slowly and as deeply as you can through your mouth, causing the piston or the ball to rise toward the top of the chamber. Hold your breath for 3-5 seconds, or for as long as possible. If the spirometer includes a coach indicator, use this to guide you in breathing. Slow down your breathing if the indicator goes above the marked areas. Remove the mouthpiece from your mouth and breathe out normally. The piston or ball will return to the bottom of the chamber. Rest for a few seconds, then repeat the steps 10 or more times. Take your time and take a few normal breaths between deep breaths so that you do not get dizzy or light-headed. Do this every 1-2 hours when you are awake. If the spirometer includes a goal marker to show the highest number you have reached (best effort), use this as a goal to work toward during each repetition. After each set of 10 deep breaths, cough a few times. This will help to make sure that your lungs are clear. If you have an incision on your chest or abdomen from surgery, place a pillow or a rolled-up towel firmly against the incision when you cough. This can help to reduce pain while taking deep breaths and coughing. General tips When you are able to get out of bed: Walk around often. Continue to take deep breaths and cough in  order to clear your lungs. Keep using the incentive spirometer until your health care provider says it is okay to stop using it. If you have been in the hospital, you may be told to keep using the spirometer at home. Contact a health care provider if: You are having difficulty using the spirometer. You have trouble using the spirometer as often as instructed. Your pain medicine is not giving enough relief for you to use the spirometer as told. You have a fever. Get help right away if: You develop shortness of breath. You develop a cough with bloody mucus from the lungs. You have fluid or blood coming from an incision site after you cough. Summary An incentive spirometer is a tool that can help you learn to take long, deep breaths to keep your lungs clear and active. You may be asked to use a spirometer after a surgery, if you have a lung problem or a history of smoking, or if you have been inactive for a long period of time. Use your incentive spirometer as instructed every 1-2 hours while you are awake. If you have an incision on your chest or abdomen, place a pillow or a rolled-up towel firmly against your incision when you cough. This will help to reduce pain. Get help right away if you have shortness of breath, you cough up bloody mucus, or blood comes from your incision when you cough. This information is not intended to replace advice given to you by your health care provider. Make sure you discuss any questions you have with your health care provider. Document Revised: 01/16/2023 Document Reviewed: 01/16/2023 Elsevier Patient Education  2024 Elsevier Inc.    Preoperative Educational Videos for Total Hip, Knee and Shoulder Replacements  To better prepare for surgery, please view our videos that explain the physical activity and discharge planning required to have the best surgical recovery at [facility].  [email or web address]  Questions? Call [phone] or email [email or web address]      Community Resource Directory to address health-related social needs:  [email or web address]

## 2024-01-07 ENCOUNTER — Ambulatory Visit

## 2024-01-07 ENCOUNTER — Other Ambulatory Visit: Payer: Self-pay

## 2024-01-07 ENCOUNTER — Encounter: Payer: Self-pay | Admitting: Orthopedic Surgery

## 2024-01-07 ENCOUNTER — Ambulatory Visit
Admission: RE | Admit: 2024-01-07 | Discharge: 2024-01-08 | Disposition: A | Discharge status: Home Health | Source: Ambulatory Visit | Attending: Orthopedic Surgery | Admitting: Orthopedic Surgery

## 2024-01-07 ENCOUNTER — Ambulatory Visit: Payer: Self-pay | Admitting: Urgent Care

## 2024-01-07 ENCOUNTER — Encounter
Admission: RE | Disposition: A | Discharge status: Home Health | Payer: Self-pay | Source: Ambulatory Visit | Attending: Orthopedic Surgery

## 2024-01-07 DIAGNOSIS — K2100 Gastro-esophageal reflux disease with esophagitis, without bleeding: Secondary | ICD-10-CM | POA: Diagnosis not present

## 2024-01-07 DIAGNOSIS — D631 Anemia in chronic kidney disease: Secondary | ICD-10-CM | POA: Diagnosis not present

## 2024-01-07 DIAGNOSIS — Z791 Long term (current) use of non-steroidal anti-inflammatories (NSAID): Secondary | ICD-10-CM | POA: Diagnosis not present

## 2024-01-07 DIAGNOSIS — M1711 Unilateral primary osteoarthritis, right knee: Secondary | ICD-10-CM | POA: Insufficient documentation

## 2024-01-07 DIAGNOSIS — K449 Diaphragmatic hernia without obstruction or gangrene: Secondary | ICD-10-CM | POA: Diagnosis not present

## 2024-01-07 DIAGNOSIS — Z79899 Other long term (current) drug therapy: Secondary | ICD-10-CM | POA: Diagnosis not present

## 2024-01-07 DIAGNOSIS — Z96651 Presence of right artificial knee joint: Secondary | ICD-10-CM

## 2024-01-07 DIAGNOSIS — Z6837 Body mass index (BMI) 37.0-37.9, adult: Secondary | ICD-10-CM | POA: Insufficient documentation

## 2024-01-07 DIAGNOSIS — E669 Obesity, unspecified: Secondary | ICD-10-CM | POA: Diagnosis not present

## 2024-01-07 DIAGNOSIS — I129 Hypertensive chronic kidney disease with stage 1 through stage 4 chronic kidney disease, or unspecified chronic kidney disease: Secondary | ICD-10-CM | POA: Insufficient documentation

## 2024-01-07 DIAGNOSIS — N189 Chronic kidney disease, unspecified: Secondary | ICD-10-CM | POA: Insufficient documentation

## 2024-01-07 HISTORY — PX: TOTAL KNEE ARTHROPLASTY: SHX125

## 2024-01-07 SURGERY — ARTHROPLASTY, KNEE, TOTAL
Anesthesia: Spinal | Site: Knee | Laterality: Right

## 2024-01-07 MED ORDER — KETOROLAC TROMETHAMINE 15 MG/ML IJ SOLN
7.5000 mg | Freq: Four times a day (QID) | INTRAMUSCULAR | Status: DC
  Administered 2024-01-07 – 2024-01-08 (×2): 7.5 mg via INTRAVENOUS
  Filled 2024-01-07 (×2): qty 1

## 2024-01-07 MED ORDER — MENTHOL 3 MG MT LOZG: 1.0000 | LOZENGE | OROMUCOSAL | Status: DC | PRN

## 2024-01-07 MED ORDER — OXYCODONE HCL 5 MG PO TABS: 5.0000 mg | ORAL_TABLET | Freq: Once | ORAL | Status: DC | PRN

## 2024-01-07 MED ORDER — LOSARTAN POTASSIUM 50 MG PO TABS
50.0000 mg | ORAL_TABLET | Freq: Every day | ORAL | Status: DC
  Administered 2024-01-07: 50 mg via ORAL
  Filled 2024-01-07: qty 1

## 2024-01-07 MED ORDER — PHENYLEPHRINE 80 MCG/ML (10ML) SYRINGE FOR IV PUSH (FOR BLOOD PRESSURE SUPPORT)
PREFILLED_SYRINGE | INTRAVENOUS | Status: AC
  Filled 2024-01-07: qty 10

## 2024-01-07 MED ORDER — HYDROCHLOROTHIAZIDE 25 MG PO TABS: 12.5000 mg | ORAL_TABLET | Freq: Every day | ORAL | Status: DC

## 2024-01-07 MED ORDER — GLYCOPYRROLATE 0.2 MG/ML IJ SOLN
INTRAMUSCULAR | Status: DC | PRN
  Administered 2024-01-07 (×2): .2 mg via INTRAVENOUS

## 2024-01-07 MED ORDER — DROPERIDOL 2.5 MG/ML IJ SOLN: 0.6250 mg | Freq: Once | INTRAMUSCULAR | Status: DC | PRN

## 2024-01-07 MED ORDER — PHENYLEPHRINE HCL-NACL 20-0.9 MG/250ML-% IV SOLN
INTRAVENOUS | Status: DC | PRN
Start: 2024-01-07 — End: 2024-01-07
  Administered 2024-01-07: 30 ug/min via INTRAVENOUS

## 2024-01-07 MED ORDER — TRAMADOL HCL 50 MG PO TABS: 50.0000 mg | ORAL_TABLET | Freq: Four times a day (QID) | ORAL | Status: DC | PRN

## 2024-01-07 MED ORDER — MIDAZOLAM HCL 2 MG/2ML IJ SOLN
INTRAMUSCULAR | Status: AC
  Filled 2024-01-07: qty 2

## 2024-01-07 MED ORDER — ONDANSETRON HCL 4 MG/2ML IJ SOLN
INTRAMUSCULAR | Status: AC
Start: 2024-01-07 — End: 2024-01-07
  Filled 2024-01-07: qty 2

## 2024-01-07 MED ORDER — ORAL CARE MOUTH RINSE: 15.0000 mL | Freq: Once | OROMUCOSAL | Status: AC

## 2024-01-07 MED ORDER — OXYCODONE HCL 5 MG/5ML PO SOLN: 5.0000 mg | Freq: Once | ORAL | Status: DC | PRN

## 2024-01-07 MED ORDER — ACETAMINOPHEN 10 MG/ML IV SOLN: 1000.0000 mg | Freq: Once | INTRAVENOUS | Status: DC | PRN

## 2024-01-07 MED ORDER — ONDANSETRON HCL 4 MG PO TABS: 4.0000 mg | ORAL_TABLET | Freq: Four times a day (QID) | ORAL | Status: DC | PRN

## 2024-01-07 MED ORDER — BUPIVACAINE HCL (PF) 0.5 % IJ SOLN
INTRAMUSCULAR | Status: DC | PRN
  Administered 2024-01-07: 3 mL via INTRATHECAL

## 2024-01-07 MED ORDER — METOCLOPRAMIDE HCL 5 MG/ML IJ SOLN: 5.0000 mg | Freq: Three times a day (TID) | INTRAMUSCULAR | Status: DC | PRN

## 2024-01-07 MED ORDER — MIDAZOLAM HCL 5 MG/5ML IJ SOLN
INTRAMUSCULAR | Status: DC | PRN
  Administered 2024-01-07: 2 mg via INTRAVENOUS

## 2024-01-07 MED ORDER — ONDANSETRON HCL 4 MG/2ML IJ SOLN
INTRAMUSCULAR | Status: DC | PRN
  Administered 2024-01-07: 4 mg via INTRAVENOUS

## 2024-01-07 MED ORDER — TRANEXAMIC ACID-NACL 1000-0.7 MG/100ML-% IV SOLN
1000.0000 mg | INTRAVENOUS | Status: AC
  Administered 2024-01-07 (×2): 1000 mg via INTRAVENOUS

## 2024-01-07 MED ORDER — SODIUM CHLORIDE 0.9 % IV SOLN: INTRAVENOUS | Status: DC

## 2024-01-07 MED ORDER — SODIUM CHLORIDE (PF) 0.9 % IJ SOLN
INTRAMUSCULAR | Status: DC | PRN
  Administered 2024-01-07: 71 mL via INTRAMUSCULAR

## 2024-01-07 MED ORDER — ACETAMINOPHEN 10 MG/ML IV SOLN
INTRAVENOUS | Status: DC | PRN
  Administered 2024-01-07: 1000 mg via INTRAVENOUS

## 2024-01-07 MED ORDER — CEFAZOLIN SODIUM-DEXTROSE 2-4 GM/100ML-% IV SOLN
INTRAVENOUS | Status: AC
  Filled 2024-01-07: qty 100

## 2024-01-07 MED ORDER — PHENYLEPHRINE 80 MCG/ML (10ML) SYRINGE FOR IV PUSH (FOR BLOOD PRESSURE SUPPORT)
PREFILLED_SYRINGE | INTRAVENOUS | Status: DC | PRN
  Administered 2024-01-07 (×7): 80 ug via INTRAVENOUS
  Administered 2024-01-07: 120 ug via INTRAVENOUS

## 2024-01-07 MED ORDER — LIDOCAINE HCL (PF) 2 % IJ SOLN
INTRAMUSCULAR | Status: AC
  Filled 2024-01-07: qty 5

## 2024-01-07 MED ORDER — HYDROCODONE-ACETAMINOPHEN 5-325 MG PO TABS
1.0000 | ORAL_TABLET | ORAL | Status: DC | PRN
  Administered 2024-01-07: 2 via ORAL
  Filled 2024-01-07: qty 2

## 2024-01-07 MED ORDER — CHLORHEXIDINE GLUCONATE 0.12 % MT SOLN
OROMUCOSAL | Status: AC
  Filled 2024-01-07: qty 15

## 2024-01-07 MED ORDER — PROPOFOL 500 MG/50ML IV EMUL
INTRAVENOUS | Status: DC | PRN
  Administered 2024-01-07: 80 ug/kg/min via INTRAVENOUS

## 2024-01-07 MED ORDER — AMLODIPINE BESYLATE 5 MG PO TABS: 5.0000 mg | ORAL_TABLET | Freq: Every day | ORAL | Status: DC

## 2024-01-07 MED ORDER — PHENOL 1.4 % MT LIQD: 1.0000 | OROMUCOSAL | Status: DC | PRN

## 2024-01-07 MED ORDER — ACETAMINOPHEN 10 MG/ML IV SOLN
INTRAVENOUS | Status: AC
  Filled 2024-01-07: qty 100

## 2024-01-07 MED ORDER — LIDOCAINE HCL (CARDIAC) PF 100 MG/5ML IV SOSY
PREFILLED_SYRINGE | INTRAVENOUS | Status: DC | PRN
  Administered 2024-01-07: 60 mg via INTRAVENOUS

## 2024-01-07 MED ORDER — FENTANYL CITRATE (PF) 100 MCG/2ML IJ SOLN
INTRAMUSCULAR | Status: DC | PRN
  Administered 2024-01-07: 25 ug via INTRAVENOUS

## 2024-01-07 MED ORDER — CEFAZOLIN SODIUM-DEXTROSE 2-4 GM/100ML-% IV SOLN
2.0000 g | INTRAVENOUS | Status: AC
  Administered 2024-01-07: 2 g via INTRAVENOUS

## 2024-01-07 MED ORDER — ONDANSETRON HCL 4 MG/2ML IJ SOLN: 4.0000 mg | Freq: Four times a day (QID) | INTRAMUSCULAR | Status: DC | PRN

## 2024-01-07 MED ORDER — KETOROLAC TROMETHAMINE 30 MG/ML IJ SOLN
INTRAMUSCULAR | Status: AC
Start: 2024-01-07 — End: 2024-01-07
  Filled 2024-01-07: qty 1

## 2024-01-07 MED ORDER — DEXAMETHASONE SOD PHOSPHATE PF 10 MG/ML IJ SOLN
8.0000 mg | Freq: Once | INTRAMUSCULAR | Status: AC
  Administered 2024-01-07: 10 mg via INTRAVENOUS

## 2024-01-07 MED ORDER — ACETAMINOPHEN 325 MG PO TABS: 325.0000 mg | ORAL_TABLET | Freq: Four times a day (QID) | ORAL | Status: DC | PRN

## 2024-01-07 MED ORDER — LACTATED RINGERS IV SOLN: INTRAVENOUS | Status: DC

## 2024-01-07 MED ORDER — FENTANYL CITRATE (PF) 100 MCG/2ML IJ SOLN
INTRAMUSCULAR | Status: AC
  Filled 2024-01-07: qty 2

## 2024-01-07 MED ORDER — DEXMEDETOMIDINE HCL IN NACL 80 MCG/20ML IV SOLN
INTRAVENOUS | Status: DC | PRN
  Administered 2024-01-07: 4 ug via INTRAVENOUS

## 2024-01-07 MED ORDER — ENOXAPARIN SODIUM 30 MG/0.3ML IJ SOSY
30.0000 mg | PREFILLED_SYRINGE | Freq: Two times a day (BID) | INTRAMUSCULAR | Status: DC
  Administered 2024-01-08: 30 mg via SUBCUTANEOUS
  Filled 2024-01-07: qty 0.3

## 2024-01-07 MED ORDER — CEFAZOLIN SODIUM-DEXTROSE 2-4 GM/100ML-% IV SOLN
2.0000 g | Freq: Four times a day (QID) | INTRAVENOUS | Status: AC
  Administered 2024-01-07 (×2): 2 g via INTRAVENOUS
  Filled 2024-01-07: qty 100

## 2024-01-07 MED ORDER — MORPHINE SULFATE (PF) 4 MG/ML IV SOLN: 0.5000 mg | INTRAVENOUS | Status: DC | PRN

## 2024-01-07 MED ORDER — BUPIVACAINE HCL (PF) 0.5 % IJ SOLN
INTRAMUSCULAR | Status: AC
Start: 2024-01-07 — End: 2024-01-07
  Filled 2024-01-07: qty 10

## 2024-01-07 MED ORDER — PHENYLEPHRINE HCL-NACL 20-0.9 MG/250ML-% IV SOLN
INTRAVENOUS | Status: AC
  Filled 2024-01-07: qty 250

## 2024-01-07 MED ORDER — SURGIPHOR WOUND IRRIGATION SYSTEM - OPTIME
TOPICAL | Status: DC | PRN
  Administered 2024-01-07: 450 mL

## 2024-01-07 MED ORDER — CHLORHEXIDINE GLUCONATE 0.12 % MT SOLN
15.0000 mL | Freq: Once | OROMUCOSAL | Status: AC
  Administered 2024-01-07: 15 mL via OROMUCOSAL

## 2024-01-07 MED ORDER — TRANEXAMIC ACID-NACL 1000-0.7 MG/100ML-% IV SOLN
INTRAVENOUS | Status: AC
  Filled 2024-01-07: qty 100

## 2024-01-07 MED ORDER — ACETAMINOPHEN 500 MG PO TABS
1000.0000 mg | ORAL_TABLET | Freq: Three times a day (TID) | ORAL | Status: DC
  Administered 2024-01-08: 1000 mg via ORAL
  Filled 2024-01-07: qty 2

## 2024-01-07 MED ORDER — LIDOCAINE HCL (PF) 1 % IJ SOLN
INTRAMUSCULAR | Status: DC | PRN
  Administered 2024-01-07: 3 mL

## 2024-01-07 MED ORDER — PROPOFOL 1000 MG/100ML IV EMUL
INTRAVENOUS | Status: AC
  Filled 2024-01-07: qty 100

## 2024-01-07 MED ORDER — FENTANYL CITRATE (PF) 100 MCG/2ML IJ SOLN: 25.0000 ug | INTRAMUSCULAR | Status: DC | PRN

## 2024-01-07 MED ORDER — DOCUSATE SODIUM 100 MG PO CAPS
100.0000 mg | ORAL_CAPSULE | Freq: Two times a day (BID) | ORAL | Status: DC
  Administered 2024-01-07 (×2): 100 mg via ORAL
  Filled 2024-01-07 (×2): qty 1

## 2024-01-07 MED ORDER — PANTOPRAZOLE SODIUM 40 MG PO TBEC
40.0000 mg | DELAYED_RELEASE_TABLET | Freq: Every day | ORAL | Status: DC
  Administered 2024-01-07: 40 mg via ORAL
  Filled 2024-01-07: qty 1

## 2024-01-07 MED ORDER — POTASSIUM CHLORIDE CRYS ER 20 MEQ PO TBCR: 10.0000 meq | EXTENDED_RELEASE_TABLET | Freq: Every day | ORAL | Status: DC

## 2024-01-07 MED ORDER — METOCLOPRAMIDE HCL 5 MG PO TABS: 5.0000 mg | ORAL_TABLET | Freq: Three times a day (TID) | ORAL | Status: DC | PRN

## 2024-01-07 MED ORDER — SODIUM CHLORIDE 0.9 % IR SOLN
Status: DC | PRN
  Administered 2024-01-07: 3000 mL

## 2024-01-07 SURGICAL SUPPLY — 58 items
BLADE PATELLA REAM PILOT HOLE (MISCELLANEOUS) IMPLANT
BLADE SAW 90X13X1.19 OSCILLAT (BLADE) IMPLANT
BLADE SAW SAG 25X90X1.19 (BLADE) ×1 IMPLANT
BLADE SAW SAG 29X58X.64 (BLADE) ×1 IMPLANT
BNDG ELASTIC 6INX 5YD STR LF (GAUZE/BANDAGES/DRESSINGS) ×1 IMPLANT
BOWL CEMENT MIX W/ADAPTER (MISCELLANEOUS) IMPLANT
BRUSH SCRUB EZ PLAIN DRY (MISCELLANEOUS) IMPLANT
CHLORAPREP W/TINT 26 (MISCELLANEOUS) ×2 IMPLANT
COMPONENT FEM STD PS KNEE 7 RT (Joint) IMPLANT
COMPONENT PATELLA PEG 3 32 (Joint) IMPLANT
COOLER ICEMAN CLASSIC (MISCELLANEOUS) ×1 IMPLANT
CUFF TRNQT CYL 24X4X16.5-23 (TOURNIQUET CUFF) IMPLANT
CUFF TRNQT CYL 30X4X21-28X (TOURNIQUET CUFF) IMPLANT
DERMABOND ADVANCED .7 DNX12 (GAUZE/BANDAGES/DRESSINGS) ×1 IMPLANT
DRAPE SHEET LG 3/4 BI-LAMINATE (DRAPES) ×2 IMPLANT
DRSG MEPILEX SACRM 8.7X9.8 (GAUZE/BANDAGES/DRESSINGS) ×1 IMPLANT
DRSG OPSITE POSTOP 4X10 (GAUZE/BANDAGES/DRESSINGS) IMPLANT
DRSG OPSITE POSTOP 4X8 (GAUZE/BANDAGES/DRESSINGS) IMPLANT
ELECTRODE REM PT RTRN 9FT ADLT (ELECTROSURGICAL) ×1 IMPLANT
GLOVE BIO SURGEON STRL SZ8 (GLOVE) ×1 IMPLANT
GLOVE BIOGEL PI IND STRL 8 (GLOVE) ×1 IMPLANT
GLOVE PI ORTHO PRO STRL 7.5 (GLOVE) ×2 IMPLANT
GLOVE PI ORTHO PRO STRL SZ8 (GLOVE) ×2 IMPLANT
GLOVE SURG SYN 7.5 PF PI (GLOVE) ×1 IMPLANT
GOWN SRG XL LONG LVL 3 NONREIN (GOWNS) ×1 IMPLANT
GOWN SRG XL LVL 3 NONREINFORCE (GOWNS) ×1 IMPLANT
GOWN STRL REUS W/ TWL LRG LVL3 (GOWN DISPOSABLE) ×1 IMPLANT
HOOD PEEL AWAY T7 (MISCELLANEOUS) ×2 IMPLANT
INSERT TIB ASF SZ 6-7/EF 10 RT (Insert) IMPLANT
KIT TURNOVER KIT A (KITS) ×1 IMPLANT
MANIFOLD NEPTUNE II (INSTRUMENTS) ×1 IMPLANT
MARKER SKIN DUAL TIP RULER LAB (MISCELLANEOUS) ×1 IMPLANT
MAT ABSORB FLUID 56X50 GRAY (MISCELLANEOUS) ×1 IMPLANT
NDL HYPO 21X1.5 SAFETY (NEEDLE) ×1 IMPLANT
NEEDLE HYPO 21X1.5 SAFETY (NEEDLE) ×1 IMPLANT
PACK TOTAL KNEE (MISCELLANEOUS) ×1 IMPLANT
PAD ARMBOARD POSITIONER FOAM (MISCELLANEOUS) ×3 IMPLANT
PAD COLD UNI WRAP-ON (PAD) ×1 IMPLANT
PENCIL SMOKE EVACUATOR (MISCELLANEOUS) ×1 IMPLANT
PIN DRILL HDLS TROCAR 75 4PK (PIN) IMPLANT
PROSTHESIS TIB KNEE PS 0D F RT (Joint) IMPLANT
SCREW FEMALE HEX FIX 25X2.5 (ORTHOPEDIC DISPOSABLE SUPPLIES) IMPLANT
SCREW HEX HEADED 3.5X27 DISP (ORTHOPEDIC DISPOSABLE SUPPLIES) IMPLANT
SLEEVE SCD COMPRESS KNEE MED (STOCKING) ×1 IMPLANT
SOL .9 NS 3000ML IRR UROMATIC (IV SOLUTION) ×1 IMPLANT
SOLN STERILE WATER 1000 ML (IV SOLUTION) ×1 IMPLANT
SOLN STERILE WATER BTL 1000 ML (IV SOLUTION) ×1 IMPLANT
SOLUTION IRRIG SURGIPHOR (IV SOLUTION) ×1 IMPLANT
STOCKINETTE IMPERV 14X48 (MISCELLANEOUS) ×1 IMPLANT
SUT STRATAFIX 14 PDO 36 VLT (SUTURE) ×1 IMPLANT
SUT VIC AB 0 CT1 36 (SUTURE) ×1 IMPLANT
SUT VIC AB 2-0 CT2 27 (SUTURE) ×2 IMPLANT
SUTURE STRATA SPIR 4-0 18 (SUTURE) ×1 IMPLANT
SUTURE VICRYL 1-0 27IN ABS (SUTURE) ×1 IMPLANT
SYR 20ML LL LF (SYRINGE) ×2 IMPLANT
TAPE CLOTH 3X10 WHT NS LF (GAUZE/BANDAGES/DRESSINGS) ×1 IMPLANT
TIP FAN IRRIG PULSAVAC PLUS (DISPOSABLE) ×1 IMPLANT
TRAP FLUID SMOKE EVACUATOR (MISCELLANEOUS) ×1 IMPLANT

## 2024-01-07 NOTE — H&P (Signed)
 History of Present Illness: The patient is an 73 y.o. female seen in clinic today for follow-up evaluation of her right knee prior to planned right total knee replacement. Patient reports ongoing severe pain over the anterior medial aspect of her right knee. She has been using Tylenol  as needed for the pain and has undergone several injections with diminishing returns over time. She reports 10 out of 10 pain at its worst limiting her walking and ability to participate in activities. She avoids activities now due to her right knee despite conservative treatments. The patient denies fevers, chills, numbness, tingling, shortness of breath, chest pain, recent illness, or any trauma.  Patient is a non-smoker, nondiabetic with an A1c of 6.1 and a BMI of 36.6 Past Medical History: Past Medical History:  Diagnosis Date  Anemia, unspecified  Degenerative arthritis of left knee  Degenerative arthritis of lumbar spine  Dysfunctional uterine bleeding  Essential hypertension, benign  Foot fracture, left  GERD (gastroesophageal reflux disease)  Left knee pain  Low back pain  Obesity  Osteoarthrosis, unspecified whether generalized or localized, lower leg  Other and unspecified hyperlipidemia   Past Surgical History: Past Surgical History:  Procedure Laterality Date  Stomach surgery 2012  ARTHROPLASTY TOTAL KNEE Left 11/08/2019  Dr. Kathlynn  JOINT REPLACEMENT Left 11/08/2019  Menz  MANIPULATION JOINT UNDER ANESTHESIA KNEE Left 12/13/2019  Dr. Kathlynn  knee arthroscopic synovectomy Left 03/06/2020  Dr. Kathlynn  Colon @ [facility] [address] 12/23/2022  Tubular adenomas/PHx CP/Three Ta's. Repeat colonoscopy in 3-5 years/CTL  CESAREAN SECTION  times 2  foot surgery  wart removal Left  Left foot   Past Family History: Family History  Problem Relation Age of Onset  Lung cancer Mother  High blood pressure (Hypertension) Mother  Sleep apnea Mother  Parkinsonism Father  Prostate cancer Father  Myocardial Infarction  (Heart attack) Other  Arthritis Other  Stroke Other   Medications: Current Outpatient Medications  Medication Sig Dispense Refill  acetaminophen  (TYLENOL ) 500 MG tablet Take 500-1,000 mg by mouth once daily as needed for Pain  amLODIPine  (NORVASC ) 5 MG tablet Take 1 tablet (5 mg total) by mouth once daily 90 tablet 3  calcium carbonate-vitamin D3 (CALTRATE 600+D) 600 mg(1,500mg ) -400 unit tablet Take 1 tablet by mouth once daily.  cetirizine (ZYRTEC) 10 MG tablet Take 1 tablet by mouth once daily as needed  clotrimazole (LOTRIMIN) 1 % cream Apply topically 2 (two) times daily 85 g 11  hydroCHLOROthiazide (HYDRODIURIL) 12.5 MG tablet TAKE 1 TABLET BY MOUTH EVERY DAY 90 tablet 1  KLOR-CON  M10 10 mEq ER tablet TAKE 1 TABLET BY MOUTH ONCE DAILY 90 tablet 1  losartan (COZAAR) 50 MG tablet TAKE 1 TABLET ONE TIME DAILY 90 tablet 3   No current facility-administered medications for this visit.   Allergies: No Known Allergies   Visit Vitals: There were no vitals filed for this visit.   Review of Systems:  A comprehensive 14 point ROS was performed, reviewed, and the pertinent orthopaedic findings are documented in the HPI.  Physical Exam: General/Constitutional: No apparent distress: well-nourished and well developed. Eyes: Pupils equal, round with synchronous movement. Pulmonary exam: Lungs clear to auscultation bilaterally no wheezing rales or rhonchi Cardiac exam: Regular rate and rhythm no obvious murmurs rubs or gallops. Integumentary: No impressive skin lesions present, except as noted in detailed exam. Neuro/Psych: Normal mood and affect, oriented to person, place and time.  Comprehensive Knee Exam: Gait Antalgic on the right  Alignment Neutral   Inspection Right  Skin Normal  appearance with no obvious deformity. No ecchymosis or erythema.  Soft Tissue No focal soft tissue swelling  Quad Atrophy None   Palpation  Right  Tenderness Joint line and parapatellar tenderness to  palpation  Crepitus No patellofemoral or tibiofemoral crepitus  Effusion None   Range of Motion Right  Flexion 0-95  Extension Full knee extension without hyperextension   Ligamentous Exam Right  Lachman Normal  Valgus 0 Normal  Valgus 30 Normal  Varus 0 Normal  Varus 30 Normal  Anterior Drawer Normal  Posterior Drawer Normal   Meniscal Exam Right  Hyperflexion Test Positive  Hyperextension Test Positive  McMurray's Negative   Neurovascular Right  Quadriceps Strength 5/5  Hamstring Strength 5/5  Hip Abductor Strength 4/5  Distal Motor Normal  Distal Sensory Normal light touch sensation  Distal Pulses Normal    Imaging Studies: I reviewed AP, lateral, sunrise, and flexed PA weightbearing x-rays of the right knee performed on 10/08/2023 images reviewed by myself. There are tricompartmental degenerative changes with joint space narrowing osteophyte formation and sclerosis. There is medial bone-on-bone articulation with subchondral cystic formation. Kellgren-Lawrence grade 4. AP, and sunrise of the left knee show status post left total knee replacement with a cemented total knee in place with some mild osteolysis under the tibial component but no other signs of loosening or periprosthetic fracture.  Assessment:  Right knee osteoarthritis  Plan: Lamica is a 73 year old female presents with right knee bone on bone arthritis. Based upon the patient's continued symptoms and failure to respond to conservative treatment, I have recommended a right total knee replacement for this patient. A long discussion took place with the patient describing what a total joint replacement is and what the procedure would entail. A knee model, similar to the implants that will be used during the operation, was utilized to demonstrate the implants. Choices of implant manufactures were discussed and reviewed. The ability to secure the implant utilizing cement or cementless (press fit) fixation was  discussed. The approach and exposure was discussed.   The hospitalization and post-operative care and rehabilitation were also discussed. The use of perioperative antibiotics and DVT prophylaxis were discussed. The risk, benefits and alternatives to a surgical intervention were discussed at length with the patient. The patient was also advised of risks related to the medical comorbidities and elevated body mass index (BMI). A lengthy discussion took place to review the most common complications including but not limited to: stiffness, loss of function, complex regional pain syndrome, deep vein thrombosis, pulmonary embolus, heart attack, stroke, infection, wound breakdown, numbness, intraoperative fracture, damage to nerves, tendon,muscles, arteries or other blood vessels, death and other possible complications from anesthesia. The patient was told that we will take steps to minimize these risks by using sterile technique, antibiotics and DVT prophylaxis when appropriate and follow the patient postoperatively in the office setting to monitor progress. The possibility of recurrent pain, no improvement in pain and actual worsening of pain were also discussed with the patient.   Patient asked about and confirms no history of any reactions to metal or metal allergy in the past.  The discharge plan of care focused on the patient going home following surgery. The patient was encouraged to make the necessary arrangements to have someone stay with them when they are discharged home.   The benefits of surgery were discussed with the patient including the potential for improving the patient's current clinical condition through operative intervention. Alternatives to surgical intervention including continued conservative management were also  discussed in detail. All questions were answered to the satisfaction of the patient. The patient participated and agreed to the plan of care as well as the use of the recommended  implants for their total knee replacement surgery. An information packet was given to the patient to review prior to surgery.   The patient received clearance for surgery. All questions answered patient agrees the above plan appropriations for a right total knee replacement.   Portions of this record have been created using Scientist, clinical (histocompatibility and immunogenetics). Dictation errors have been sought, but may not have been identified and corrected.  Arthea Sheer MD

## 2024-01-07 NOTE — Interval H&P Note (Signed)
Patient history and physical updated. Consent reviewed including risks, benefits, and alternatives to surgery. Patient agrees with above plan to proceed with right total knee arthroplasty  

## 2024-01-07 NOTE — Transfer of Care (Signed)
 Immediate Anesthesia Transfer of Care Note  Patient: Kristy Richardson  Procedure(s) Performed: ARTHROPLASTY, KNEE, TOTAL (Right: Knee)  Patient Location: PACU  Anesthesia Type:Spinal  Level of Consciousness: awake and drowsy  Airway & Oxygen Therapy: Patient Spontanous Breathing  Post-op Assessment: Report given to RN and Post -op Vital signs reviewed and stable  Post vital signs: Reviewed and stable  Last Vitals:  Vitals Value Taken Time  BP 93/62 01/07/24 12:03  Temp    Pulse 40 01/07/24 12:04  Resp 30 01/07/24 12:04  SpO2 97 % 01/07/24 12:04  Vitals shown include unfiled device data.  Last Pain:  Vitals:   01/07/24 0820  TempSrc: Temporal  PainSc: 0-No pain         Complications: No notable events documented.

## 2024-01-07 NOTE — Evaluation (Signed)
 Physical Therapy Evaluation Patient Details Name: Kristy Richardson MRN: [id number] DOB: 1950-05-31 Today's Date: 01/07/2024  History of Present Illness  73 y/o female s/o R TKA 01/07/24, h/o R TKA 8/'21.  Clinical Impression  Pt very pleasant and motivated with PT.  She was ultimately able to perform at or above expected POD0 goals.  Pt did well with mobility, transfers and was able to ambulate ~100 ft all w/o direct assist.  She showed good strength and ROM with exercises and had only minimal fatigue, pain or other issues.  Pt will benefit from continued PT per TKA protocols.       If plan is discharge home, recommend the following: Assist for transportation;Assistance with cooking/housework   Can travel by private vehicle        Equipment Recommendations BSC/3in1  Recommendations for Other Services       Functional Status Assessment Patient has had a recent decline in their functional status and demonstrates the ability to make significant improvements in function in a reasonable and predictable amount of time.     Precautions / Restrictions Precautions Precautions: Fall;Knee Precaution Booklet Issued: Yes (comment) (HEP) Restrictions Weight Bearing Restrictions Per Provider Order: Yes RLE Weight Bearing Per Provider Order: Weight bearing as tolerated      Mobility  Bed Mobility Overal bed mobility: Modified Independent             General bed mobility comments: minimal cuing and use of rails, able to rise w/o assist    Transfers Overall transfer level: Needs assistance Equipment used: Rolling walker (2 wheels) Transfers: Sit to/from Stand Sit to Stand: Contact guard assist           General transfer comment: cuing for set up, sequencing.  effortful, but able to rise w/o phyiscal assist    Ambulation/Gait Ambulation/Gait assistance: Contact guard assist Gait Distance (Feet): 100 Feet Assistive device: Rolling walker (2 wheels)         General Gait  Details: Pt with some initial expected guarding, hesitancy but with cuing and confidence building she showed increased cadence and tolerance with little limp and walker reliance  Stairs            Wheelchair Mobility     Tilt Bed    Modified Rankin (Stroke Patients Only)       Balance Overall balance assessment: Modified Independent                                           Pertinent Vitals/Pain Pain Assessment Pain Assessment: 0-10 Pain Score: 3  Pain Location: R knee    Home Living Family/patient expects to be discharged to:: Private residence Living Arrangements: Spouse/significant other Available Help at Discharge: Available 24 hours/day;Family   Home Access: Stairs to enter Entrance Stairs-Rails: Left Entrance Stairs-Number of Steps: 3   Home Layout: Able to live on main level with bedroom/bathroom Home Equipment: Agricultural consultant (2 wheels)      Prior Function Prior Level of Function : Independent/Modified Independent;Driving             Mobility Comments: reports doing regular exercises including water aerobics 5x/wk ADLs Comments: Independent, recent difficulty 2/2 R knee pain     Extremity/Trunk Assessment   Upper Extremity Assessment Upper Extremity Assessment: Overall WFL for tasks assessed    Lower Extremity Assessment Lower Extremity Assessment: Overall WFL for tasks assessed (expected post-op  weakness, AROM SLRs)       Communication   Communication Communication: No apparent difficulties    Cognition Arousal: Alert Behavior During Therapy: WFL for tasks assessed/performed   PT - Cognitive impairments: No apparent impairments                         Following commands: Intact       Cueing Cueing Techniques: Verbal cues, Visual cues, Gestural cues, Tactile cues     General Comments General comments (skin integrity, edema, etc.): Pt did very well with POD0 PT session    Exercises Total Joint  Exercises Ankle Circles/Pumps: AROM, 10 reps Quad Sets: Strengthening, 10 reps Short Arc Quad: AROM, Strengthening, 10 reps Heel Slides: AROM, 5 reps (with resisted leg ext) Hip ABduction/ADduction: Strengthening, 10 reps Straight Leg Raises: AROM, 10 reps Knee Flexion: PROM, 5 reps Goniometric ROM: 0-92   Assessment/Plan    PT Assessment Patient needs continued PT services  PT Problem List Decreased strength;Decreased range of motion;Decreased activity tolerance;Decreased balance;Decreased mobility;Decreased safety awareness;Pain;Decreased knowledge of use of DME       PT Treatment Interventions DME instruction;Gait training;Stair training;Functional mobility training;Therapeutic activities;Therapeutic exercise;Balance training;Neuromuscular re-education;Patient/family education    PT Goals (Current goals can be found in the Care Plan section)  Acute Rehab PT Goals Patient Stated Goal: go home tomorrow PT Goal Formulation: With patient Time For Goal Achievement: 01/20/24 Potential to Achieve Goals: Good    Frequency BID     Co-evaluation               AM-PAC PT 6 Clicks Mobility  Outcome Measure Help needed turning from your back to your side while in a flat bed without using bedrails?: None Help needed moving from lying on your back to sitting on the side of a flat bed without using bedrails?: None Help needed moving to and from a bed to a chair (including a wheelchair)?: A Little Help needed standing up from a chair using your arms (e.g., wheelchair or bedside chair)?: A Little Help needed to walk in hospital room?: A Little Help needed climbing 3-5 steps with a railing? : A Little 6 Click Score: 20    End of Session Equipment Utilized During Treatment: Gait belt Activity Tolerance: Patient tolerated treatment well Patient left: in chair;with call bell/phone within reach;with family/visitor present Nurse Communication: Mobility status PT Visit Diagnosis:  Muscle weakness (generalized) (M62.81);Difficulty in walking, not elsewhere classified (R26.2);Pain Pain - Right/Left: Right Pain - part of body: Knee    Time: 1625-1701 PT Time Calculation (min) (ACUTE ONLY): 36 min   Charges:   PT Evaluation $PT Eval Low Complexity: 1 Low PT Treatments $Gait Training: 8-22 mins $Therapeutic Exercise: 8-22 mins PT General Charges $$ ACUTE PT VISIT: 1 Visit         Carmin JONELLE Deed, DPT 01/07/2024, 5:45 PM

## 2024-01-07 NOTE — Anesthesia Preprocedure Evaluation (Signed)
 Anesthesia Evaluation  Patient identified by MRN, date of birth, ID band Patient awake    Reviewed: Allergy & Precautions, H&P , NPO status , Patient's Chart, lab work & pertinent test results, reviewed documented beta blocker date and time   Airway Mallampati: II   Neck ROM: full    Dental  (+) Poor Dentition   Pulmonary neg pulmonary ROS   Pulmonary exam normal        Cardiovascular Exercise Tolerance: Good hypertension, On Medications negative cardio ROS Normal cardiovascular exam Rhythm:regular Rate:Normal     Neuro/Psych  Neuromuscular disease  negative psych ROS   GI/Hepatic Neg liver ROS, hiatal hernia,GERD  Medicated,,  Endo/Other  negative endocrine ROS    Renal/GU CRFRenal disease  negative genitourinary   Musculoskeletal   Abdominal   Peds  Hematology  (+) Blood dyscrasia, anemia   Anesthesia Other Findings Past Medical History: No date: Anemia No date: Arthritis of both knees No date: Degenerative arthritis of lumbar spine No date: Dysfunctional uterine bleeding No date: GERD (gastroesophageal reflux disease) No date: H/O cesarean section     Comment:  x 2 No date: Hepatic cyst No date: Hiatal hernia No date: Hyperlipidemia No date: Hypertension No date: Osteoarthritis of right knee No date: Osteopenia No date: Pre-diabetes No date: Renal cyst, right Past Surgical History: 11/08/2019: APPLICATION OF WOUND VAC; Left     Comment:  Procedure: APPLICATION OF WOUND VAC;  Surgeon: Kathlynn Sharper, MD;  Location: [facility] ORS;  Service: Orthopedics;               Laterality: Left;  [id number]  No date: CESAREAN SECTION     Comment:  x2 03/06/2020: KNEE ARTHROSCOPY; Left     Comment:  Procedure: Left knee arthroscopic synovectomy;  Surgeon:              Kathlynn Sharper, MD;  Location: [facility] ORS;  Service:               Orthopedics;  Laterality: Left; 12/13/2019: KNEE CLOSED REDUCTION;  Left     Comment:  Procedure: Left total knee manipulation;  Surgeon: Kathlynn Sharper, MD;  Location: [facility] ORS;  Service: Orthopedics;               Laterality: Left; 2012: STOMACH SURGERY 11/08/2019: TOTAL KNEE ARTHROPLASTY; Left     Comment:  Procedure: TOTAL KNEE ARTHROPLASTY;  Surgeon: Kathlynn Sharper, MD;  Location: [facility] ORS;  Service: Orthopedics;               Laterality: Left; BMI    Body Mass Index: 37.11 kg/m     Reproductive/Obstetrics negative OB ROS                              Anesthesia Physical Anesthesia Plan  ASA: 3  Anesthesia Plan: Spinal   Post-op Pain Management:    Induction:   PONV Risk Score and Plan: 4 or greater  Airway Management Planned:   Additional Equipment:   Intra-op Plan:   Post-operative Plan:   Informed Consent: I have reviewed the patients History and Physical, chart, labs and discussed the procedure including the risks, benefits and alternatives for the proposed anesthesia with the patient or authorized  representative who has indicated his/her understanding and acceptance.     Dental Advisory Given  Plan Discussed with: CRNA  Anesthesia Plan Comments:         Anesthesia Quick Evaluation

## 2024-01-07 NOTE — Progress Notes (Signed)
 Patient is not able to walk the distance required to go the bathroom, or she is unable to safely negotiate stairs required to access the bathroom.  A 3in1 BSC will alleviate this problem.       Lollie Marrow, PA-C [facility]

## 2024-01-07 NOTE — Op Note (Signed)
 Patient Name: Kristy Richardson  FMW:[id number]  Pre-Operative Diagnosis: Right knee Osteoarthritis  Post-Operative Diagnosis: (same)  Procedure: Right Total Knee Arthroplasty  Components/Implants: Femur: Persona Size 7 CR PPS   Tibia: Persona Size F OsseoTi  Poly: 10mm MC  Patella: 32x9 symmetric OsseoTi  Femoral Valgus Cut Angle: 5 degrees  Distal Femoral Re-cut: none  Patella Resurfacing: yes   Date of Surgery: 01/07/2024  Surgeon: Arthea Sheer MD  Assistant: Debby Amber PA (present and scrubbed throughout the case, critical for assistance with exposure, retraction, instrumentation, and closure)   Anesthesiologist: Adams  Anesthesia: Spinal   Tourniquet Time: 38 min  EBL: 25cc  IVF: 800cc  Complications: None   Brief history: The patient is a 73 year old female with a history of osteoarthritis of the right knee with pain limiting their range of motion and activities of daily living, which has failed multiple attempts at conservative therapy.  The risks and benefits of total knee arthroplasty as definitive surgical treatment were discussed with the patient, who opted to proceed with the operation.  After outpatient medical clearance and optimization was completed the patient was admitted to [facility] for the procedure.  All preoperative films were reviewed and an appropriate surgical plan was made prior to surgery. Preoperative range of motion was 0 to 95.   Description of procedure: The patient was brought to the operating room where laterality was confirmed by all those present to be the right side.   Spinal anesthesia was administered and the patient received an intravenous dose of antibiotics for surgical prophylaxis and a dose of tranexamic acid .  Patient is positioned supine on the operating room table with all bony prominences well-padded.  A well-padded tourniquet was applied to the right thigh.  The knee was then prepped and draped in usual  sterile fashion with multiple layers of adhesive and nonadhesive drapes.  All of those present in the operating room participated in a surgical timeout laterality and patient were confirmed.   An Esmarch was wrapped around the extremity and the leg was elevated and the knee flexed.  The tourniquet was inflated to a pressure of 250 mmHg. The Esmarch was removed and the leg was brought down to full extension.  The patella and tibial tubercle identified and outlined using a marking pen and a midline skin incision was made with a knife carried through the subcutaneous tissue down to the extensor retinaculum.  After exposure of the extensor mechanism the medial parapatellar arthrotomy was performed with a scalpel and electrocautery extending down medial and distal to the tibial tubercle taking care to avoid incising the patellar tendon.   A standard medial release was performed over the proximal tibia.  The knee was brought into extension in order to excise the fat pad taking care not to damage the patella tendon.  The superior soft tissue was removed from the anterior surface of the distal femur to visualize for the procedure.  The knee was then brought into flexion with the patella subluxed laterally and subluxing the tibia anteriorly.  The ACL was transected and removed with electrocautery and additional soft tissue was removed from the proximal surface of the tibia to fully expose. The PCL was found to be intact and was preserved.  An extramedullary tibial cutting guide was then applied to the leg with a spring-loaded ankle clamp placed around the distal tibia just above the malleoli the angulation of the guide was adjusted to give some posterior slope in the tibial resection with an  appropriate varus/valgus alignment.  The resection guide was then pinned to the proximal tibia and the proximal tibial surface was resected with an oscillating saw.  Careful attention was paid to ensure the blade did not disrupt any  of the soft tissues including any lateral or medial ligament.  Attention was then turned to the femur, with the knee slightly flexed a opening drill was used to enter the medullary canal of the femur.  After removing the drill marrow was suctioned out to decompress the distal femur.  An intramedullary femoral guide was then inserted into the drill hole and the alignment guide was seated firmly against the distal end of the medial femoral condyle.  The distal femoral cutting guide was then attached and pinned securely to the anterior surface of the femur and the intramedullary rod and alignment guide was removed.  Distal femur resection was then performed with an oscillating saw with retractors protecting medial and laterally.   The distal cutting block was then removed and the extension gap was checked with a spacer.  Extension gap was found to be appropriately sized to accommodate the spacer block.   The femoral sizing guide was then placed securely into the posterior condyles of the femur and the femoral size was measured and determined to be 7.  The size 7; 4-in-1 cutting guide was placed in position and secured with 2 pins.  The anterior posterior and chamfer resections were then performed with an oscillating saw.  Bony fragments and osteophytes were then removed.  Using a lamina spreader the posterior medial and lateral condyles were checked for additional osteophytes and posterior soft tissue remnants.  Any remaining meniscus was removed at this time.  Periarticular injection was performed in the meniscal rims and posterior capsule with aspiration performed to ensure no intravascular injection.   The tibia was then exposed and the tibial trial was pinned onto the plateau after confirming appropriate orientation and rotation.  Using the drill bushing the tibia was prepared to the appropriate drill depth.  Tibial broach impactor was then driven through the punch guide using a mallet.  The femoral trial  component was then inserted onto the femur. A trial tibial polyethylene bearing was then placed and the knee was reduced.  The knee achieved full extension with no hyperextension and was found to be balanced in flexion and extension with the trials in place.  The knee was then brought into full extension the patella was everted and held with 2 Kocher clamps.  The articular surface of the patella was then resected with an patella reamer and saw after careful measurement with a caliper.  The patella was then prepared with the drill guide and a trial patella was placed.  The knee was then taken through range of motion and it was found that the patella articulated appropriately with the trochlea and good patellofemoral motion without subluxation.    The correct final components for implantation were confirmed and opened by the circulator nurse.  The knee was irrigated with normal saline via pulsatile lavage to remove any bony debris or soft tissue.  The prepared surface of the tibia was exposed and the tibial component was implanted with good bony contact.  The femoral component was then placed and impacted showing good coverage and a good snug fit.  The patella was then cleared off and the patella compression tool was used to apply the patellar component with symmetric compression onto the patella.  The tibial component was then irrigated and cleared  of any debris and a real polyethylene component was placed and engaged with the locking mechanism.  The knee was then injected with the particular cocktail.  The knee was taken through range of motion and found to be stable in flexion and extension with patellar tracking.  The knee was then irrigated with copious amount of normal saline via pulsatile lavage to remove all loose bodies and other debris.  The knee was then irrigated with surgiphor betadine based wash and reirrigated with saline.  The tourniquet was then dropped and all bleeding vessels were identified and  coagulated.  The arthrotomy was approximated with #1 Vicryl and closed with #1 Stratafix suture.  The knee was brought into slight flexion and the subcutaneous tissues were closed with 0 Vicryl, 2-0 Vicryl and a running subcuticular 4-0 stratafix barbed suture.  Skin was then glued with Dermabond.  A sterile adhesive dressing was then placed along with a sequential compression device to the calf, a Ted stocking, and a cryotherapy cuff.   Sponge, needle, and Lap counts were all correct at the end of the case.   The patient was transferred off of the operating room table to a hospital bed, good pulses were found distally on the operative side.  The patient was transferred to the recovery room in stable condition.

## 2024-01-07 NOTE — Anesthesia Procedure Notes (Signed)
 Spinal  Patient location during procedure: OR Start time: 01/07/2024 10:11 AM End time: 01/07/2024 10:16 AM Reason for block: surgical anesthesia Staffing Performed: resident/CRNA  Resident/CRNA: Trudy Rankin LABOR, CRNA Performed by: Trudy Rankin LABOR, CRNA Authorized by: Myra Lynwood MATSU, MD   Preanesthetic Checklist Completed: patient identified, IV checked, site marked, risks and benefits discussed, surgical consent, monitors and equipment checked, pre-op evaluation and timeout performed Spinal Block Patient position: sitting Prep: Betadine Patient monitoring: heart rate, continuous pulse ox, blood pressure and cardiac monitor Approach: midline Location: L4-5 Injection technique: single-shot Needle Needle type: Quincke  Needle gauge: 22 G Needle length: 9 cm Assessment Events: CSF return Additional Notes Negative paresthesia. Negative blood return. Positive free-flowing CSF. Expiration date of kit checked and confirmed. Patient tolerated procedure well, without complications. First attempt 24ga pencan with introducer = unsuccessful. 2nd attempt 22ga quincke no introducer = successful. Needle fully inserted with skin tented inward due to depth of CSF space

## 2024-01-07 NOTE — Discharge Instructions (Signed)
 Instructions after Total Knee Replacement   Kristy Richardson M.D.     Dept. of Orthopaedics & Sports Medicine  [facility]  [address]  [place], [place]  [address]  Phone: [phone]   Fax: [phone]    DIET: Drink plenty of non-alcoholic fluids. Resume your normal diet. Include foods high in fiber.  ACTIVITY:  You may use crutches or a walker with weight-bearing as tolerated, unless instructed otherwise. You may be weaned off of the walker or crutches by your Physical Therapist.  Do NOT place pillows under the knee. Anything placed under the knee could limit your ability to straighten the knee.   Continue doing gentle exercises. Exercising will reduce the pain and swelling, increase motion, and prevent muscle weakness.   Please continue to use the TED compression stockings for 2 weeks. You may remove the stockings at night, but should reapply them in the morning. Do not drive or operate any equipment until instructed.  WOUND CARE:  Continue to use the PolarCare or ice packs periodically to reduce pain and swelling. You may begin showering 3 days after surgery with honeycomb dressing. Remove honeycomb dressing 7 days after surgery and continue showering. Allow dermabond to fall off on its own.  MEDICATIONS: You may resume your regular medications. Please take the pain medication as prescribed on the medication. Do not take pain medication on an empty stomach. You have been given a prescription for a blood thinner (Lovenox or Coumadin). Please take the medication as instructed. (NOTE: After completing a 2 week course of Lovenox, take one 81 mg Enteric-coated aspirin twice a day for 3 additional weeks. This along with elevation will help reduce the possibility of phlebitis in your operated leg.) Do not drive or drink alcoholic beverages when taking pain medications.  POSTOPERATIVE CONSTIPATION PROTOCOL Constipation - defined medically as fewer than three stools per  week and severe constipation as less than one stool per week.  One of the most common issues patients have following surgery is constipation.  Even if you have a regular bowel pattern at home, your normal regimen is likely to be disrupted due to multiple reasons following surgery.  Combination of anesthesia, postoperative narcotics, change in appetite and fluid intake all can affect your bowels.  In order to avoid complications following surgery, here are some recommendations in order to help you during your recovery period.  Colace (docusate) - Pick up an over-the-counter form of Colace or another stool softener and take twice a day as long as you are requiring postoperative pain medications.  Take with a full glass of water daily.  If you experience loose stools or diarrhea, hold the colace until you stool forms back up.  If your symptoms do not get better within 1 week or if they get worse, check with your doctor.  Dulcolax (bisacodyl) - Pick up over-the-counter and take as directed by the product packaging as needed to assist with the movement of your bowels.  Take with a full glass of water.  Use this product as needed if not relieved by Colace only.   MiraLax (polyethylene glycol) - Pick up over-the-counter to have on hand.  MiraLax is a solution that will increase the amount of water in your bowels to assist with bowel movements.  Take as directed and can mix with a glass of water, juice, soda, coffee, or tea.  Take if you go more than two days without a movement. Do not use MiraLax more than once per day.  Call your doctor if you are still constipated or irregular after using this medication for 7 days in a row.  If you continue to have problems with postoperative constipation, please contact the office for further assistance and recommendations.  If you experience "the worst abdominal pain ever" or develop nausea or vomiting, please contact the office immediatly for further recommendations for  treatment.   CALL THE OFFICE FOR: Temperature above 101 degrees Excessive bleeding or drainage on the dressing. Excessive swelling, coldness, or paleness of the toes. Persistent nausea and vomiting.  FOLLOW-UP:  You should have an appointment to return to the office in 14 days after surgery. Arrangements have been made for continuation of Physical Therapy (either home therapy or outpatient therapy).

## 2024-01-08 ENCOUNTER — Other Ambulatory Visit: Payer: Self-pay

## 2024-01-08 ENCOUNTER — Encounter: Payer: Self-pay | Admitting: Orthopedic Surgery

## 2024-01-08 DIAGNOSIS — M1711 Unilateral primary osteoarthritis, right knee: Secondary | ICD-10-CM | POA: Diagnosis not present

## 2024-01-08 LAB — BASIC METABOLIC PANEL WITH GFR
Anion gap: 7 (ref 5–15)
BUN: 20 mg/dL (ref 8–23)
CO2: 26 mmol/L (ref 22–32)
Calcium: 8.8 mg/dL — ABNORMAL LOW (ref 8.9–10.3)
Chloride: 107 mmol/L (ref 98–111)
Creatinine, Ser: 0.74 mg/dL (ref 0.44–1.00)
GFR, Estimated: >60 mL/min (ref >60)
Glucose, Bld: 121 mg/dL — ABNORMAL HIGH (ref 70–99)
Potassium: 3.4 mmol/L — ABNORMAL LOW (ref 3.5–5.1)
Sodium: 140 mmol/L (ref 135–145)

## 2024-01-08 LAB — CBC
HCT: 35 % — ABNORMAL LOW (ref 36.0–46.0)
Hemoglobin: 11.7 g/dL — ABNORMAL LOW (ref 12.0–15.0)
MCH: 30.3 pg (ref 26.0–34.0)
MCHC: 33.4 g/dL (ref 30.0–36.0)
MCV: 90.7 fL (ref 80.0–100.0)
Platelets: 232 K/uL (ref 150–400)
RBC: 3.86 MIL/uL — ABNORMAL LOW (ref 3.87–5.11)
RDW: 12.6 % (ref 11.5–15.5)
WBC: 10.7 K/uL — ABNORMAL HIGH (ref 4.0–10.5)
nRBC: 0 % (ref 0.0–0.2)

## 2024-01-08 MED ORDER — ONDANSETRON HCL 4 MG PO TABS
4.0000 mg | ORAL_TABLET | Freq: Four times a day (QID) | ORAL | 0 refills | Status: AC | PRN
  Filled 2024-01-08: qty 20, 5d supply, fill #0

## 2024-01-08 MED ORDER — DOCUSATE SODIUM 100 MG PO CAPS
100.0000 mg | ORAL_CAPSULE | Freq: Two times a day (BID) | ORAL | 0 refills | Status: AC
  Filled 2024-01-08: qty 10, 5d supply, fill #0

## 2024-01-08 MED ORDER — ENOXAPARIN SODIUM 40 MG/0.4ML IJ SOSY
40.0000 mg | PREFILLED_SYRINGE | INTRAMUSCULAR | 0 refills | Status: AC
  Filled 2024-01-08: qty 5.6, 14d supply, fill #0

## 2024-01-08 MED ORDER — ACETAMINOPHEN 500 MG PO TABS
1000.0000 mg | ORAL_TABLET | Freq: Three times a day (TID) | ORAL | 0 refills | Status: AC
  Filled 2024-01-08: qty 30, 5d supply, fill #0

## 2024-01-08 MED ORDER — OXYCODONE HCL 5 MG PO TABS
2.5000 mg | ORAL_TABLET | Freq: Three times a day (TID) | ORAL | 0 refills | Status: DC | PRN
  Filled 2024-01-08: qty 10, 7d supply, fill #0

## 2024-01-08 MED ORDER — ONDANSETRON HCL 4 MG/2ML IJ SOLN
4.0000 mg | Freq: Four times a day (QID) | INTRAMUSCULAR | 0 refills | Status: DC | PRN
  Filled 2024-01-08: qty 2, 1d supply, fill #0

## 2024-01-08 MED ORDER — CELECOXIB 100 MG PO CAPS
100.0000 mg | ORAL_CAPSULE | Freq: Two times a day (BID) | ORAL | 0 refills | Status: AC
  Filled 2024-01-08: qty 28, 14d supply, fill #0

## 2024-01-08 MED ORDER — TRAMADOL HCL 50 MG PO TABS
50.0000 mg | ORAL_TABLET | Freq: Four times a day (QID) | ORAL | 0 refills | Status: AC | PRN
  Filled 2024-01-08: qty 30, 8d supply, fill #0

## 2024-01-08 NOTE — Discharge Summary (Cosign Needed Addendum)
 Physician Discharge Summary  Patient ID: Kristy Richardson MRN: [id number] DOB/AGE: 1950-12-28 73 y.o.  Admit date: 01/07/2024 Discharge date: 01/08/2024  Admission Diagnoses:  Primary osteoarthritis of right knee [M17.11] S/P total knee arthroplasty, right [Z96.651]   Discharge Diagnoses: Patient Active Problem List   Diagnosis Date Noted   S/P total knee arthroplasty, right 01/07/2024   S/P TKR (total knee replacement) using cement, left 11/08/2019    Past Medical History:  Diagnosis Date   Anemia    Arthritis of both knees    Degenerative arthritis of lumbar spine    Dysfunctional uterine bleeding    GERD (gastroesophageal reflux disease)    H/O cesarean section    x 2   Hepatic cyst    Hiatal hernia    Hyperlipidemia    Hypertension    Osteoarthritis of right knee    Osteopenia    Pre-diabetes    Renal cyst, right      Transfusion: none   Consultants (if any):   Discharged Condition: Improved  Hospital Course: Kristy Richardson is an 73 y.o. female who was admitted 01/07/2024 with a diagnosis of S/P total knee arthroplasty, right and went to the operating room on 01/07/2024 and underwent the above named procedures.    Surgeries: Procedure(s): ARTHROPLASTY, KNEE, TOTAL on 01/07/2024 Patient tolerated the surgery well. Taken to PACU where she was stabilized and then transferred to the orthopedic floor.  Started on Lovenox  30 mg q 12 hrs. TEDs and SCDs applied bilaterally. Heels elevated on bed. No evidence of DVT. Negative Homan. Physical therapy started on day #1 for gait training and transfer. OT started day #1 for ADL and assisted devices.  Patient's IV was d/c on day #1. Patient was able to safely and independently complete all PT goals. PT recommending discharge to home.    On post op day #1 patient was stable and ready for discharge to home with HHPT.  Implants: Femur: Persona Size 7 CR PPS   Tibia: Persona Size F OsseoTi  Poly: 10mm MC  Patella: 32x9  symmetric OsseoTi   She was given perioperative antibiotics:  Anti-infectives (From admission, onward)    Start     Dose/Rate Route Frequency Ordered Stop   01/07/24 1630  ceFAZolin  (ANCEF ) IVPB 2g/100 mL premix        2 g 200 mL/hr over 30 Minutes Intravenous Every 6 hours 01/07/24 1521 01/07/24 2230   01/07/24 0815  ceFAZolin  (ANCEF ) IVPB 2g/100 mL premix        2 g 200 mL/hr over 30 Minutes Intravenous On call to O.R. 01/07/24 0810 01/07/24 1017     .  She was given sequential compression devices, early ambulation, and Lovenox  TEDs for DVT prophylaxis.  She benefited maximally from the hospital stay and there were no complications.    Recent vital signs:  Vitals:   01/08/24 0400 01/08/24 0819  BP: 133/84 (!) 123/90  Pulse: 64 63  Resp: 16 17  Temp: 98 F (36.7 C) 98.1 F (36.7 C)  SpO2: 98% 96%    Recent laboratory studies:  Lab Results  Component Value Date   HGB 11.7 (L) 01/08/2024   HGB 10.3 (L) 11/09/2019   HGB 11.6 (L) 11/08/2019   Lab Results  Component Value Date   WBC 10.7 (H) 01/08/2024   PLT 232 01/08/2024   No results found for: INR Lab Results  Component Value Date   NA 140 01/08/2024   K 3.4 (L) 01/08/2024   CL 107 01/08/2024  CO2 26 01/08/2024   BUN 20 01/08/2024   CREATININE 0.74 01/08/2024   GLUCOSE 121 (H) 01/08/2024    Discharge Medications:   Allergies as of 01/08/2024   No Known Allergies      Medication List     TAKE these medications    acetaminophen  500 MG tablet Commonly known as: TYLENOL  Take 2 tablets (1,000 mg total) by mouth every 8 (eight) hours. What changed:  how much to take when to take this reasons to take this   amLODipine  5 MG tablet Commonly known as: NORVASC  Take 5 mg by mouth every morning.   celecoxib 100 MG capsule Commonly known as: CeleBREX Take 1 capsule (100 mg total) by mouth 2 (two) times daily for 14 days.   cetirizine 10 MG tablet Commonly known as: ZYRTEC Take 10 mg by mouth  daily.   docusate sodium  100 MG capsule Commonly known as: COLACE Take 1 capsule (100 mg total) by mouth 2 (two) times daily.   enoxaparin  40 MG/0.4ML injection Commonly known as: LOVENOX  Inject 0.4 mLs (40 mg total) into the skin daily for 14 days.   hydrochlorothiazide 12.5 MG tablet Commonly known as: HYDRODIURIL Take 12.5 mg by mouth every morning.   losartan 50 MG tablet Commonly known as: COZAAR Take 50 mg by mouth daily.   ondansetron  4 MG tablet Commonly known as: ZOFRAN  Take 1 tablet (4 mg total) by mouth every 6 (six) hours as needed for nausea.   oxyCODONE  5 MG immediate release tablet Commonly known as: Roxicodone  Take 0.5 tablets (2.5 mg total) by mouth every 8 (eight) hours as needed for breakthrough pain.   potassium chloride  10 MEQ tablet Commonly known as: KLOR-CON  M Take 10 mEq by mouth daily.   traMADol  50 MG tablet Commonly known as: ULTRAM  Take 1 tablet (50 mg total) by mouth every 6 (six) hours as needed for moderate pain (pain score 4-6).   Vitamin D  50 MCG (2000 UT) tablet Take 2,000 Units by mouth daily.               Durable Medical Equipment  (From admission, onward)           Start     Ordered   01/07/24 1353  For home use only DME Walker rolling  Once       Question Answer Comment  Walker: With 5 Inch Wheels   Patient needs a walker to treat with the following condition Total knee replacement status      01/07/24 1352   01/07/24 1353  For home use only DME 3 n 1  Once        01/07/24 1352            Diagnostic Studies: DG Knee Right Port Result Date: 01/07/2024 CLINICAL DATA:  Status post right total knee arthroplasty. EXAM: PORTABLE RIGHT KNEE - 1-2 VIEW COMPARISON:  None Available. FINDINGS: Right total knee prosthesis in satisfactory position and alignment. No fracture or dislocation. IMPRESSION: Satisfactory postoperative appearance of a right total knee prosthesis. Electronically Signed   By: Elspeth Bathe M.D.    On: 01/07/2024 14:44    Disposition: Discharge disposition: 06-Home-Health Care Svc          Follow-up Information     Charlene Debby BROCKS, PA-C Follow up in 2 week(s).   Specialties: Orthopedic Surgery, Emergency Medicine Contact information: [address] [place] [address] [phone]  Signed: Debby JAYSON Amber 01/08/2024, 8:21 AM

## 2024-01-08 NOTE — Progress Notes (Signed)
 Patient meets all requirements to be discharged safely home. DME is present, and husband at bedside for discharge instructions.

## 2024-01-08 NOTE — Progress Notes (Signed)
 Physical Therapy Treatment Patient Details Name: Kristy Richardson MRN: [id number] DOB: [birth year] Today's Date: 01/08/2024   History of Present Illness 73 y/o female s/o R TKA 01/07/24, h/o R TKA 8/'21.    PT Comments  Pt was long sitting in bed upon arrival. She is A and O x 4. Agreeable to session and eager for OOB. Pt demonstrated safe abilities to exit bed, stand, and tolerate ambulation ~ 200 ft. Safely perform stairs to simulate home entry/exit. Did perform stretching/ROM however is encouraged to continue to stretch throughout the day and after DC.  Pt is cleared from an acute PT standpoint for safe DC home with HHPT to follow.    If plan is discharge home, recommend the following: Assist for transportation;Assistance with cooking/housework     Equipment Recommendations  BSC/3in1       Precautions / Restrictions Precautions Precautions: Fall;Knee Precaution Booklet Issued: Yes (comment) Restrictions Weight Bearing Restrictions Per Provider Order: Yes RLE Weight Bearing Per Provider Order: Weight bearing as tolerated     Mobility  Bed Mobility Overal bed mobility: Modified Independent   Transfers Overall transfer level: Needs assistance Equipment used: Rolling walker (2 wheels) Transfers: Sit to/from Stand Sit to Stand: Supervision   Ambulation/Gait Ambulation/Gait assistance: Supervision Gait Distance (Feet): 200 Feet Assistive device: Rolling walker (2 wheels) Gait Pattern/deviations: Step-through pattern Gait velocity: decreased  General Gait Details: Pt tolerated ambulation ~ 200 ft with RW without LOB    Balance Overall balance assessment: Modified Independent       Communication Communication Communication: No apparent difficulties  Cognition Arousal: Alert Behavior During Therapy: WFL for tasks assessed/performed   PT - Cognitive impairments: No apparent impairments      Following commands: Intact      Cueing Cueing Techniques: Verbal cues,  Tactile cues  Exercises Total Joint Exercises Goniometric ROM: 3-82 less ROM today        Pertinent Vitals/Pain Pain Assessment Pain Assessment: 0-10 Pain Score: 5  Pain Location: R knee Pain Intervention(s): Limited activity within patient's tolerance, Monitored during session, Premedicated before session, Repositioned     PT Goals (current goals can now be found in the care plan section) Acute Rehab PT Goals Patient Stated Goal: go home Progress towards PT goals: Progressing toward goals    Frequency    BID       AM-PAC PT 6 Clicks Mobility   Outcome Measure  Help needed turning from your back to your side while in a flat bed without using bedrails?: None Help needed moving from lying on your back to sitting on the side of a flat bed without using bedrails?: None Help needed moving to and from a bed to a chair (including a wheelchair)?: A Little Help needed standing up from a chair using your arms (e.g., wheelchair or bedside chair)?: A Little Help needed to walk in hospital room?: A Little Help needed climbing 3-5 steps with a railing? : A Little 6 Click Score: 20    End of Session Equipment Utilized During Treatment: Gait belt Activity Tolerance: Patient tolerated treatment well Patient left: in chair;with call bell/phone within reach;with family/visitor present Nurse Communication: Mobility status PT Visit Diagnosis: Muscle weakness (generalized) (M62.81);Difficulty in walking, not elsewhere classified (R26.2);Pain Pain - Right/Left: Right Pain - part of body: Knee     Time: 0737-0802 PT Time Calculation (min) (ACUTE ONLY): 25 min  Charges:    $Gait Training: 8-22 mins $Therapeutic Activity: 8-22 mins PT General Charges $$ ACUTE PT VISIT: 1 Visit  Rankin Essex PTA 01/08/24, 9:06 AM

## 2024-01-08 NOTE — Plan of Care (Signed)
 See chart

## 2024-01-08 NOTE — Anesthesia Postprocedure Evaluation (Signed)
 Anesthesia Post Note  Patient: Kristy Richardson  Procedure(s) Performed: ARTHROPLASTY, KNEE, TOTAL (Right: Knee)  Patient location during evaluation: Nursing Unit Anesthesia Type: Spinal Level of consciousness: awake Pain management: pain level controlled Respiratory status: spontaneous breathing Cardiovascular status: stable Postop Assessment: no headache Anesthetic complications: no   No notable events documented.   Last Vitals:  Vitals:   01/07/24 2014 01/08/24 0400  BP: (!) 144/82 133/84  Pulse: 70 64  Resp: 16 16  Temp: (!) 36.2 C 36.7 C  SpO2: 97% 98%    Last Pain:  Vitals:   01/07/24 2151  TempSrc:   PainSc: 4                  Shona Earnie Fare

## 2024-01-08 NOTE — Progress Notes (Signed)
   Subjective: 1 Day Post-Op Procedure(s) (LRB): ARTHROPLASTY, KNEE, TOTAL (Right) Patient reports pain as mild.   Patient is well, and has had no acute complaints or problems Denies any CP, SOB, ABD pain. We will continue therapy today.  Plan is to go Home after hospital stay.  Objective: Vital signs in last 24 hours: Temp:  [96.9 F (36.1 C)-98.2 F (36.8 C)] 98 F (36.7 C) (10/17 0400) Pulse Rate:  [46-76] 64 (10/17 0400) Resp:  [13-25] 16 (10/17 0400) BP: (93-149)/(55-99) 133/84 (10/17 0400) SpO2:  [94 %-100 %] 98 % (10/17 0400) Weight:  [95 kg] 95 kg (10/16 0820)  Intake/Output from previous day: 10/16 0701 - 10/17 0700 In: 1101.1 [I.V.:815; IV Piggyback:286.1] Out: 125 [Urine:100; Blood:25] Intake/Output this shift: No intake/output data recorded.  Recent Labs    01/08/24 0624  HGB 11.7*   Recent Labs    01/08/24 0624  WBC 10.7*  RBC 3.86*  HCT 35.0*  PLT 232   Recent Labs    01/08/24 0624  NA 140  K 3.4*  CL 107  CO2 26  BUN 20  CREATININE 0.74  GLUCOSE 121*  CALCIUM 8.8*   No results for input(s): LABPT, INR in the last 72 hours.  EXAM General - Patient is Alert, Appropriate, and Oriented Extremity - Neurovascular intact Sensation intact distally Intact pulses distally Dorsiflexion/Plantar flexion intact Dressing - dressing C/D/I and no drainage Motor Function - intact, moving foot and toes well on exam.   Past Medical History:  Diagnosis Date   Anemia    Arthritis of both knees    Degenerative arthritis of lumbar spine    Dysfunctional uterine bleeding    GERD (gastroesophageal reflux disease)    H/O cesarean section    x 2   Hepatic cyst    Hiatal hernia    Hyperlipidemia    Hypertension    Osteoarthritis of right knee    Osteopenia    Pre-diabetes    Renal cyst, right     Assessment/Plan:   1 Day Post-Op Procedure(s) (LRB): ARTHROPLASTY, KNEE, TOTAL (Right) Principal Problem:   S/P total knee arthroplasty,  right  Estimated body mass index is 37.11 kg/m as calculated from the following:   Height as of this encounter: 5' 3 (1.6 m).   Weight as of this encounter: 95 kg. Advance diet Up with therapy Pain well controlled Labs and VSS CM to assist with discharge to home with HHPT today  DVT Prophylaxis - Lovenox , TED hose, and SCDs Weight-Bearing as tolerated to right leg   T. Medford Amber, PA-C [facility] 01/08/2024, 8:11 AM

## 2024-01-13 ENCOUNTER — Other Ambulatory Visit: Payer: Self-pay

## 2024-01-14 ENCOUNTER — Other Ambulatory Visit: Payer: Self-pay

## 2024-01-15 ENCOUNTER — Other Ambulatory Visit: Payer: Self-pay

## 2024-01-18 ENCOUNTER — Other Ambulatory Visit: Payer: Self-pay

## 2024-01-18 MED ORDER — OXYCODONE HCL 5 MG PO TABS
2.5000 mg | ORAL_TABLET | Freq: Four times a day (QID) | ORAL | 0 refills | Status: AC | PRN
  Filled 2024-01-18: qty 15, 4d supply, fill #0

## 2024-01-20 ENCOUNTER — Other Ambulatory Visit: Payer: Self-pay

## 2024-01-22 ENCOUNTER — Other Ambulatory Visit: Payer: Self-pay
